# Patient Record
Sex: Female | Born: 1995 | Race: Black or African American | Hispanic: No | Marital: Single | Smoking: Never smoker
Health system: Southern US, Community
[De-identification: ages and names within clinical notes are randomized; demographics above are authoritative.]

## PROBLEM LIST (undated history)

## (undated) DIAGNOSIS — B999 Unspecified infectious disease: Secondary | ICD-10-CM

## (undated) DIAGNOSIS — Z789 Other specified health status: Secondary | ICD-10-CM

## (undated) HISTORY — PX: NO PAST SURGERIES: SHX2092

---
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
## 2017-07-29 NOTE — L&D Delivery Note (Signed)
Delivery Note Patient pushed for less than 10 minutes after she was noted to be C/C/+3. At 12:18 PM a viable and healthy female was delivered via Vaginal, Spontaneous (Presentation:Direct OP).  APGAR: 8/9 weight: pending.  Baby placed on maternal abdomen.  Delayed cord clamping was done and the cord cut by the father.   Cord Blood obtained. Placenta delivered spontaneously intact, 3 vessel cord noted.  Uterine atony alleviated with IV pitocin and massage.   No lacerations noted. Patient tolerated delivery well.  No complications.    Anesthesia:  Epidural Episiotomy:  N/A Lacerations:  None Suture Repair: N/A Est. Blood Loss (mL):  Titan level pending. Approximately 50 mL  Mom to postpartum.  Baby to Couplet care / Skin to Skin.  Phyllis Farmer, Phyllis Farmer 06/30/2018, 12:41 PM

## 2017-10-11 ENCOUNTER — Encounter (HOSPITAL_COMMUNITY): Payer: Self-pay

## 2017-10-11 ENCOUNTER — Other Ambulatory Visit: Payer: Self-pay

## 2017-10-11 DIAGNOSIS — R102 Pelvic and perineal pain: Secondary | ICD-10-CM | POA: Insufficient documentation

## 2017-10-11 DIAGNOSIS — Z3201 Encounter for pregnancy test, result positive: Secondary | ICD-10-CM | POA: Insufficient documentation

## 2017-10-11 LAB — COMPREHENSIVE METABOLIC PANEL
ALT: 13 U/L — ABNORMAL LOW (ref 14–54)
AST: 19 U/L (ref 15–41)
Albumin: 4.6 g/dL (ref 3.5–5.0)
Alkaline Phosphatase: 74 U/L (ref 38–126)
Anion gap: 8 (ref 5–15)
BUN: 13 mg/dL (ref 6–20)
CO2: 25 mmol/L (ref 22–32)
Calcium: 9.6 mg/dL (ref 8.9–10.3)
Chloride: 104 mmol/L (ref 101–111)
Creatinine, Ser: 0.71 mg/dL (ref 0.44–1.00)
GFR calc Af Amer: >60 mL/min (ref >60)
GFR calc non Af Amer: >60 mL/min (ref >60)
Glucose, Bld: 83 mg/dL (ref 65–99)
Potassium: 3.4 mmol/L — ABNORMAL LOW (ref 3.5–5.1)
Sodium: 137 mmol/L (ref 135–145)
Total Bilirubin: 1.6 mg/dL — ABNORMAL HIGH (ref 0.3–1.2)
Total Protein: 7.6 g/dL (ref 6.5–8.1)

## 2017-10-11 LAB — CBC
HCT: 39.4 % (ref 36.0–46.0)
Hemoglobin: 12.8 g/dL (ref 12.0–15.0)
MCH: 28.7 pg (ref 26.0–34.0)
MCHC: 32.5 g/dL (ref 30.0–36.0)
MCV: 88.3 fL (ref 78.0–100.0)
Platelets: 250 10*3/uL (ref 150–400)
RBC: 4.46 MIL/uL (ref 3.87–5.11)
RDW: 12.8 % (ref 11.5–15.5)
WBC: 7.7 10*3/uL (ref 4.0–10.5)

## 2017-10-11 LAB — I-STAT BETA HCG BLOOD, ED (MC, WL, AP ONLY): I-stat hCG, quantitative: 36.7 m[IU]/mL — ABNORMAL HIGH (ref <5)

## 2017-10-11 LAB — POC URINE PREG, ED: Preg Test, Ur: NEGATIVE

## 2017-10-11 NOTE — ED Triage Notes (Addendum)
Per pt; abdominal pain began last night after having unprotected intercourse w/her long-term partner.  Also c/o nausea last night w/chills.  Today, abd pain continues, but "when I walk on my toes it feels better."  States pain is intermittent and feels sharp when it comes. States she hasn't voided at all today but denies dysuria or vag d/c.

## 2017-10-12 ENCOUNTER — Emergency Department (HOSPITAL_COMMUNITY)
Admission: EM | Admit: 2017-10-12 | Discharge: 2017-10-12 | Disposition: A | Discharge status: Home / Self Care | Payer: Self-pay | Attending: Emergency Medicine | Admitting: Emergency Medicine

## 2017-10-12 ENCOUNTER — Emergency Department (HOSPITAL_COMMUNITY): Payer: Self-pay

## 2017-10-12 DIAGNOSIS — R102 Pelvic and perineal pain: Secondary | ICD-10-CM

## 2017-10-12 LAB — URINALYSIS, ROUTINE W REFLEX MICROSCOPIC
Bilirubin Urine: NEGATIVE
Glucose, UA: NEGATIVE mg/dL
Hgb urine dipstick: NEGATIVE
Leukocytes, UA: NEGATIVE
Nitrite: NEGATIVE
Protein, ur: NEGATIVE mg/dL
Specific Gravity, Urine: >1.03 — ABNORMAL HIGH (ref 1.005–1.030)
pH: 6 (ref 5.0–8.0)

## 2017-10-12 NOTE — ED Provider Notes (Signed)
[facility]-EMERGENCY DEPT Provider Note   CSN: [id number] Arrival date & time: 10/11/17  1829     History   Chief Complaint Chief Complaint  Patient presents with  . Abdominal Pain    HPI Phyllis Farmer is a 22 y.o. female.  Patient is a 22 year old female with no significant past medical history.  She presents today for evaluation of pelvic pain.  This started yesterday evening after intercourse with her boyfriend.  She denies any vaginal bleeding or discharge.  She denies any dysuria, hematuria, constipation, or diarrhea.  Her pain is crampy in nature.   The history is provided by the patient.  Abdominal Pain   This is a new problem. The current episode started yesterday. The problem occurs constantly. The problem has not changed since onset.The pain is located in the suprapubic region. The quality of the pain is cramping. The pain is moderate. Pertinent negatives include fever, diarrhea, hematochezia, melena, constipation and dysuria. Nothing aggravates the symptoms. Nothing relieves the symptoms.    No past medical history on file.  There are no active problems to display for this patient.     OB History    No data available       Home Medications    Prior to Admission medications   Not on File    Family History No family history on file.  Social History Social History   Tobacco Use  . Smoking status: Never Smoker  . Smokeless tobacco: Never Used  Substance Use Topics  . Alcohol use: Yes    Comment: occasionally  . Drug use: Yes    Types: Marijuana    Comment: daily     Allergies   Patient has no allergy information on record.   Review of Systems Review of Systems  Constitutional: Negative for fever.  Gastrointestinal: Positive for abdominal pain. Negative for constipation, diarrhea, hematochezia and melena.  Genitourinary: Negative for dysuria.  All other systems reviewed and are negative.    Physical  Exam Updated Vital Signs BP 124/74 (BP Location: Left Arm)   Pulse 71   Temp 98.9 F (37.2 C) (Oral)   Resp 18   Wt 55.3 kg (122 lb)   LMP 09/19/2017   SpO2 100%   Physical Exam  Constitutional: She is oriented to person, place, and time. She appears well-developed and well-nourished. No distress.  HENT:  Head: Normocephalic and atraumatic.  Neck: Normal range of motion. Neck supple.  Cardiovascular: Normal rate and regular rhythm. Exam reveals no gallop and no friction rub.  No murmur heard. Pulmonary/Chest: Effort normal and breath sounds normal. No respiratory distress. She has no wheezes.  Abdominal: Soft. Bowel sounds are normal. She exhibits no distension. There is tenderness in the suprapubic area. There is no rigidity, no rebound and no guarding.  There is tenderness to palpation in the suprapubic region.  Musculoskeletal: Normal range of motion.  Neurological: She is alert and oriented to person, place, and time.  Skin: Skin is warm and dry. She is not diaphoretic.  Nursing note and vitals reviewed.    ED Treatments / Results  Labs (all labs ordered are listed, but only abnormal results are displayed) Labs Reviewed  COMPREHENSIVE METABOLIC PANEL - Abnormal; Notable for the following components:      Result Value   Potassium 3.4 (*)    ALT 13 (*)    Total Bilirubin 1.6 (*)    All other components within normal limits  URINALYSIS, ROUTINE W REFLEX MICROSCOPIC -  Abnormal; Notable for the following components:   Specific Gravity, Urine >1.030 (*)    Ketones, ur TRACE (*)    All other components within normal limits  I-STAT BETA HCG BLOOD, ED ([facility], [facility], [facility] ONLY) - Abnormal; Notable for the following components:   I-stat hCG, quantitative 36.7 (*)    All other components within normal limits  CBC  POC URINE PREG, ED    EKG  EKG Interpretation None       Radiology No results found.  Procedures Procedures (including critical care time)  Medications Ordered  in ED Medications - No data to display   Initial Impression / Assessment and Plan / ED Course  I have reviewed the triage vital signs and the nursing notes.  Pertinent labs & imaging results that were available during my care of the patient were reviewed by me and considered in my medical decision making (see chart for details).  Patient presenting with lower abdominal discomfort.  Her urinalysis is unremarkable and urine pregnancy test reveals negative results.  She did have a quantitative beta hCG obtained which was mildly positive.  I am uncertain as to which test, the urine or the blood is most accurate at this point but nothing is showing up on the ultrasound indicating an ectopic pregnancy or intrauterine pregnancy.  I will have her follow-up at [facility] for repeat ultrasound and hCG level.  Final Clinical Impressions(s) / ED Diagnoses   Final diagnoses:  None    ED Discharge Orders    None       Geoffery Lyons, MD 10/12/17 [phone]

## 2017-10-12 NOTE — Discharge Instructions (Signed)
Tylenol 1000 mg every 8 hours as needed for pain.  [facility] 5-7 days for repeat hCG and ultrasound.  Return to the emergency department sooner if you develop worsening pain, high fever, severe bleeding, or other new and concerning symptoms.

## 2018-01-30 LAB — OB RESULTS CONSOLE GC/CHLAMYDIA: Chlamydia: NEGATIVE

## 2018-02-09 LAB — OB RESULTS CONSOLE HEPATITIS B SURFACE ANTIGEN: Hepatitis B Surface Ag: NEGATIVE

## 2018-02-09 LAB — OB RESULTS CONSOLE RUBELLA ANTIBODY, IGM: Rubella: IMMUNE

## 2018-02-09 LAB — OB RESULTS CONSOLE RPR: RPR: NONREACTIVE

## 2018-02-09 LAB — OB RESULTS CONSOLE GC/CHLAMYDIA: Gonorrhea: NEGATIVE

## 2018-02-09 LAB — OB RESULTS CONSOLE GBS: GBS: POSITIVE

## 2018-02-09 LAB — OB RESULTS CONSOLE ABO/RH: RH Type: POSITIVE

## 2018-02-09 LAB — OB RESULTS CONSOLE ANTIBODY SCREEN: Antibody Screen: NEGATIVE

## 2018-02-18 ENCOUNTER — Ambulatory Visit (HOSPITAL_COMMUNITY)
Admission: RE | Admit: 2018-02-18 | Discharge: 2018-02-18 | Disposition: A | Discharge status: Home / Self Care | Payer: Medicaid Other | Source: Ambulatory Visit | Attending: Obstetrics and Gynecology | Admitting: Obstetrics and Gynecology

## 2018-02-18 ENCOUNTER — Encounter (HOSPITAL_COMMUNITY): Payer: Self-pay

## 2018-02-18 ENCOUNTER — Other Ambulatory Visit (HOSPITAL_COMMUNITY): Payer: Self-pay | Admitting: Obstetrics and Gynecology

## 2018-02-18 DIAGNOSIS — Z3689 Encounter for other specified antenatal screening: Secondary | ICD-10-CM

## 2018-02-18 DIAGNOSIS — Z3A21 21 weeks gestation of pregnancy: Secondary | ICD-10-CM

## 2018-02-18 DIAGNOSIS — R9389 Abnormal findings on diagnostic imaging of other specified body structures: Secondary | ICD-10-CM

## 2018-03-03 ENCOUNTER — Ambulatory Visit (HOSPITAL_COMMUNITY)
Admission: RE | Admit: 2018-03-03 | Discharge: 2018-03-03 | Disposition: A | Discharge status: Home / Self Care | Payer: Medicaid Other | Source: Ambulatory Visit | Attending: Obstetrics and Gynecology | Admitting: Obstetrics and Gynecology

## 2018-03-03 ENCOUNTER — Encounter (HOSPITAL_COMMUNITY): Payer: Self-pay

## 2018-06-18 ENCOUNTER — Encounter (HOSPITAL_COMMUNITY): Payer: Self-pay

## 2018-06-18 ENCOUNTER — Inpatient Hospital Stay (HOSPITAL_COMMUNITY)
Admission: AD | Admit: 2018-06-18 | Discharge: 2018-06-18 | Disposition: A | Discharge status: Home / Self Care | Payer: Medicaid Other | Source: Ambulatory Visit | Attending: Obstetrics and Gynecology | Admitting: Obstetrics and Gynecology

## 2018-06-18 DIAGNOSIS — Z3A38 38 weeks gestation of pregnancy: Secondary | ICD-10-CM | POA: Diagnosis not present

## 2018-06-18 DIAGNOSIS — N898 Other specified noninflammatory disorders of vagina: Secondary | ICD-10-CM | POA: Diagnosis not present

## 2018-06-18 DIAGNOSIS — O26893 Other specified pregnancy related conditions, third trimester: Secondary | ICD-10-CM | POA: Diagnosis present

## 2018-06-18 DIAGNOSIS — Z3689 Encounter for other specified antenatal screening: Secondary | ICD-10-CM

## 2018-06-18 HISTORY — DX: Other specified health status: Z78.9

## 2018-06-18 LAB — WET PREP, GENITAL
Sperm: NONE SEEN
Trich, Wet Prep: NONE SEEN
Yeast Wet Prep HPF POC: NONE SEEN

## 2018-06-18 LAB — POCT FERN TEST: POCT Fern Test: NEGATIVE

## 2018-06-18 LAB — AMNISURE RUPTURE OF MEMBRANE (ROM) NOT AT ARMC: Amnisure ROM: NEGATIVE

## 2018-06-18 NOTE — Discharge Instructions (Signed)
Braxton Hicks Contractions °Contractions of the uterus can occur throughout pregnancy, but they are not always a sign that you are in labor. You may have practice contractions called Braxton Hicks contractions. These false labor contractions are sometimes confused with true labor. °What are Braxton Hicks contractions? °Braxton Hicks contractions are tightening movements that occur in the muscles of the uterus before labor. Unlike true labor contractions, these contractions do not result in opening (dilation) and thinning of the cervix. Toward the end of pregnancy (32-34 weeks), Braxton Hicks contractions can happen more often and may become stronger. These contractions are sometimes difficult to tell apart from true labor because they can be very uncomfortable. You should not feel embarrassed if you go to the hospital with false labor. °Sometimes, the only way to tell if you are in true labor is for your health care provider to look for changes in the cervix. The health care provider will do a physical exam and may monitor your contractions. If you are not in true labor, the exam should show that your cervix is not dilating and your water has not broken. °If there are other health problems associated with your pregnancy, it is completely safe for you to be sent home with false labor. You may continue to have Braxton Hicks contractions until you go into true labor. °How to tell the difference between true labor and false labor °True labor °· Contractions last 30-70 seconds. °· Contractions become very regular. °· Discomfort is usually felt in the top of the uterus, and it spreads to the lower abdomen and low back. °· Contractions do not go away with walking. °· Contractions usually become more intense and increase in frequency. °· The cervix dilates and gets thinner. °False labor °· Contractions are usually shorter and not as strong as true labor contractions. °· Contractions are usually irregular. °· Contractions  are often felt in the front of the lower abdomen and in the groin. °· Contractions may go away when you walk around or change positions while lying down. °· Contractions get weaker and are shorter-lasting as time goes on. °· The cervix usually does not dilate or become thin. °Follow these instructions at home: °· Take over-the-counter and prescription medicines only as told by your health care provider. °· Keep up with your usual exercises and follow other instructions from your health care provider. °· Eat and drink lightly if you think you are going into labor. °· If Braxton Hicks contractions are making you uncomfortable: °? Change your position from lying down or resting to walking, or change from walking to resting. °? Sit and rest in a tub of warm water. °? Drink enough fluid to keep your urine pale yellow. Dehydration may cause these contractions. °? Do slow and deep breathing several times an hour. °· Keep all follow-up prenatal visits as told by your health care provider. This is important. °Contact a health care provider if: °· You have a fever. °· You have continuous pain in your abdomen. °Get help right away if: °· Your contractions become stronger, more regular, and closer together. °· You have fluid leaking or gushing from your vagina. °· You pass blood-tinged mucus (bloody show). °· You have bleeding from your vagina. °· You have low back pain that you never had before. °· You feel your baby’s head pushing down and causing pelvic pressure. °· Your baby is not moving inside you as much as it used to. °Summary °· Contractions that occur before labor are called Braxton   Hicks contractions, false labor, or practice contractions. °· Braxton Hicks contractions are usually shorter, weaker, farther apart, and less regular than true labor contractions. True labor contractions usually become progressively stronger and regular and they become more frequent. °· Manage discomfort from Braxton Hicks contractions by  changing position, resting in a warm bath, drinking plenty of water, or practicing deep breathing. °This information is not intended to replace advice given to you by your health care provider. Make sure you discuss any questions you have with your health care provider. °Document Released: 11/28/2016 Document Revised: 11/28/2016 Document Reviewed: 11/28/2016 °Elsevier Interactive Patient Education © 2018 Else[employer] Inc. ° °

## 2018-06-18 NOTE — MAU Note (Signed)
Pt stated she has been gushing fluid 2-3 days. Fluid is clear. Stated she has been having some ctx off and on about q 10 min.

## 2018-06-18 NOTE — MAU Provider Note (Signed)
History   [id number]   Chief Complaint  Patient presents with  . Rupture of Membranes    HPI Phyllis Farmer is a 22 y.o. female  G1P0 @38 .6 wks here with report of leaking clear fluid around 0800.  Leaking of fluid has continued. Pt reports irregular contractions. She denies vaginal bleeding. Last intercourse was not recent. She reports good fetal movement. All other systems negative.    Patient's last menstrual period was 09/19/2017.  OB History  Gravida Para Term Preterm AB Living  1            SAB TAB Ectopic Multiple Live Births               # Outcome Date GA Lbr Len/2nd Weight Sex Delivery Anes PTL Lv  1 Current             Past Medical History:  Diagnosis Date  . Medical history non-contributory     No family history on file.  Social History   Socioeconomic History  . Marital status: Single    Spouse name: Not on file  . Number of children: Not on file  . Years of education: Not on file  . Highest education level: Not on file  Occupational History  . Not on file  Social Needs  . Financial resource strain: Not on file  . Food insecurity:    Worry: Not on file    Inability: Not on file  . Transportation needs:    Medical: Not on file    Non-medical: Not on file  Tobacco Use  . Smoking status: Never Smoker  . Smokeless tobacco: Never Used  Substance and Sexual Activity  . Alcohol use: Yes    Comment: occasionally  . Drug use: Yes    Types: Marijuana    Comment: daily  . Sexual activity: Yes    Birth control/protection: None  Lifestyle  . Physical activity:    Days per week: Not on file    Minutes per session: Not on file  . Stress: Not on file  Relationships  . Social connections:    Talks on phone: Not on file    Gets together: Not on file    Attends religious service: Not on file    Active member of club or organization: Not on file    Attends meetings of clubs or organizations: Not on file    Relationship status: Not on file  Other  Topics Concern  . Not on file  Social History Narrative  . Not on file    No Known Allergies  No current facility-administered medications on file prior to encounter.    Current Outpatient Medications on File Prior to Encounter  Medication Sig Dispense Refill  . Prenatal Vit-Fe Fumarate-FA (PRENATAL VITAMIN PO) Take by mouth.       Review of Systems  Gastrointestinal: Negative for abdominal pain.  Genitourinary: Positive for vaginal discharge. Negative for vaginal bleeding.     Physical Exam   Vitals:   06/18/18 1928 06/18/18 2128  BP: 136/75 (!) 101/45  Pulse: 83   Resp: 18   Temp: 99.1 F (37.3 C)     Physical Exam  Constitutional: She is oriented to person, place, and time. She appears well-developed and well-nourished. No distress.  HENT:  Head: Normocephalic and atraumatic.  Neck: Normal range of motion.  Cardiovascular: Normal rate.  Respiratory: Effort normal. No respiratory distress.  Genitourinary:  Genitourinary Comments: SSE: mod thin yellow discharge, no pool, fern neg  SVE 1.5/60/-2, vtx  Musculoskeletal: Normal range of motion.  Neurological: She is alert and oriented to person, place, and time.  Skin: Skin is warm and dry.  Psychiatric: She has a normal mood and affect.  EFM: 135 bpm, mod variability, + accels, no decels Toco: irregular  Results for orders placed or performed during the hospital encounter of 06/18/18 (from the past 24 hour(s))  Wet prep, genital     Status: Abnormal   Collection Time: 06/18/18  8:10 PM  Result Value Ref Range   Yeast Wet Prep HPF POC NONE SEEN NONE SEEN   Trich, Wet Prep NONE SEEN NONE SEEN   Clue Cells Wet Prep HPF POC PRESENT (A) NONE SEEN   WBC, Wet Prep HPF POC TOO NUMEROUS TO COUNT (A) NONE SEEN   Sperm NONE SEEN   Amnisure rupture of membrane (rom)not at [facility]     Status: None   Collection Time: 06/18/18  8:54 PM  Result Value Ref Range   Amnisure ROM NEGATIVE   Fern Test     Status: None   Collection  Time: 06/18/18  9:08 PM  Result Value Ref Range   POCT Fern Test Negative = intact amniotic membranes     MAU Course  Procedures  MDM Labs ordered and reviewed. No evidence of SROM. GC pending. Stable for discharge home.   Assessment and Plan   1. [redacted] weeks gestation of pregnancy   2. NST (non-stress test) reactive   3. Vaginal discharge during pregnancy in third trimester    Discharge home Follow up at [facility] as scheduled PTL precautions  Allergies as of 06/18/2018   No Known Allergies     Medication List    TAKE these medications   PRENATAL VITAMIN PO Take by mouth.      Donette Larry, CNM 06/18/2018 9:45 PM

## 2018-06-19 LAB — GC/CHLAMYDIA PROBE AMP (~~LOC~~) NOT AT ARMC
Chlamydia: NEGATIVE
Neisseria Gonorrhea: NEGATIVE

## 2018-06-22 ENCOUNTER — Inpatient Hospital Stay (HOSPITAL_COMMUNITY)
Admission: AD | Admit: 2018-06-22 | Discharge: 2018-06-22 | Disposition: A | Discharge status: Home / Self Care | Payer: Medicaid Other | Source: Ambulatory Visit | Attending: Obstetrics and Gynecology | Admitting: Obstetrics and Gynecology

## 2018-06-22 ENCOUNTER — Encounter (HOSPITAL_COMMUNITY): Payer: Self-pay

## 2018-06-22 DIAGNOSIS — Z0371 Encounter for suspected problem with amniotic cavity and membrane ruled out: Secondary | ICD-10-CM | POA: Diagnosis not present

## 2018-06-22 DIAGNOSIS — Z3A39 39 weeks gestation of pregnancy: Secondary | ICD-10-CM | POA: Insufficient documentation

## 2018-06-22 DIAGNOSIS — O471 False labor at or after 37 completed weeks of gestation: Secondary | ICD-10-CM | POA: Diagnosis not present

## 2018-06-22 DIAGNOSIS — O479 False labor, unspecified: Secondary | ICD-10-CM

## 2018-06-22 DIAGNOSIS — O4292 Full-term premature rupture of membranes, unspecified as to length of time between rupture and onset of labor: Secondary | ICD-10-CM | POA: Diagnosis not present

## 2018-06-22 LAB — POCT FERN TEST: POCT Fern Test: NEGATIVE

## 2018-06-22 LAB — AMNISURE RUPTURE OF MEMBRANE (ROM) NOT AT ARMC: Amnisure ROM: NEGATIVE

## 2018-06-22 NOTE — Discharge Instructions (Signed)
Braxton Hicks Contractions °Contractions of the uterus can occur throughout pregnancy, but they are not always a sign that you are in labor. You may have practice contractions called Braxton Hicks contractions. These false labor contractions are sometimes confused with true labor. °What are Braxton Hicks contractions? °Braxton Hicks contractions are tightening movements that occur in the muscles of the uterus before labor. Unlike true labor contractions, these contractions do not result in opening (dilation) and thinning of the cervix. Toward the end of pregnancy (32-34 weeks), Braxton Hicks contractions can happen more often and may become stronger. These contractions are sometimes difficult to tell apart from true labor because they can be very uncomfortable. You should not feel embarrassed if you go to the hospital with false labor. °Sometimes, the only way to tell if you are in true labor is for your health care provider to look for changes in the cervix. The health care provider will do a physical exam and may monitor your contractions. If you are not in true labor, the exam should show that your cervix is not dilating and your water has not broken. °If there are other health problems associated with your pregnancy, it is completely safe for you to be sent home with false labor. You may continue to have Braxton Hicks contractions until you go into true labor. °How to tell the difference between true labor and false labor °True labor °· Contractions last 30-70 seconds. °· Contractions become very regular. °· Discomfort is usually felt in the top of the uterus, and it spreads to the lower abdomen and low back. °· Contractions do not go away with walking. °· Contractions usually become more intense and increase in frequency. °· The cervix dilates and gets thinner. °False labor °· Contractions are usually shorter and not as strong as true labor contractions. °· Contractions are usually irregular. °· Contractions  are often felt in the front of the lower abdomen and in the groin. °· Contractions may go away when you walk around or change positions while lying down. °· Contractions get weaker and are shorter-lasting as time goes on. °· The cervix usually does not dilate or become thin. °Follow these instructions at home: °· Take over-the-counter and prescription medicines only as told by your health care provider. °· Keep up with your usual exercises and follow other instructions from your health care provider. °· Eat and drink lightly if you think you are going into labor. °· If Braxton Hicks contractions are making you uncomfortable: °? Change your position from lying down or resting to walking, or change from walking to resting. °? Sit and rest in a tub of warm water. °? Drink enough fluid to keep your urine pale yellow. Dehydration may cause these contractions. °? Do slow and deep breathing several times an hour. °· Keep all follow-up prenatal visits as told by your health care provider. This is important. °Contact a health care provider if: °· You have a fever. °· You have continuous pain in your abdomen. °Get help right away if: °· Your contractions become stronger, more regular, and closer together. °· You have fluid leaking or gushing from your vagina. °· You pass blood-tinged mucus (bloody show). °· You have bleeding from your vagina. °· You have low back pain that you never had before. °· You feel your baby’s head pushing down and causing pelvic pressure. °· Your baby is not moving inside you as much as it used to. °Summary °· Contractions that occur before labor are called Braxton   Hicks contractions, false labor, or practice contractions. °· Braxton Hicks contractions are usually shorter, weaker, farther apart, and less regular than true labor contractions. True labor contractions usually become progressively stronger and regular and they become more frequent. °· Manage discomfort from Braxton Hicks contractions by  changing position, resting in a warm bath, drinking plenty of water, or practicing deep breathing. °This information is not intended to replace advice given to you by your health care provider. Make sure you discuss any questions you have with your health care provider. °Document Released: 11/28/2016 Document Revised: 11/28/2016 Document Reviewed: 11/28/2016 °Elsevier Interactive Patient Education © 2018 Elsevier Inc. ° °Safe Medications in Pregnancy  ° °Acne: °Benzoyl Peroxide °Salicylic Acid ° °Backache/Headache: °Tylenol: 2 regular strength every 4 hours OR °             2 Extra strength every 6 hours ° °Colds/Coughs/Allergies: °Benadryl (alcohol free) 25 mg every 6 hours as needed °Breath right strips °Claritin °Cepacol throat lozenges °Chloraseptic throat spray °Cold-Eeze- up to three times per day °Cough drops, alcohol free °Flonase (by prescription only) °Guaifenesin °Mucinex °Robitussin DM (plain only, alcohol free) °Saline nasal spray/drops °Sudafed (pseudoephedrine) & Actifed ** use only after [redacted] weeks gestation and if you do not have high blood pressure °Tylenol °Vicks Vaporub °Zinc lozenges °Zyrtec  ° °Constipation: °Colace °Ducolax suppositories °Fleet enema °Glycerin suppositories °Metamucil °Milk of magnesia °Miralax °Senokot °Smooth move tea ° °Diarrhea: °Kaopectate °Imodium A-D ° °*NO pepto Bismol ° °Hemorrhoids: °Anusol °Anusol HC °Preparation H °Tucks ° °Indigestion: °Tums °Maalox °Mylanta °Zantac  °Pepcid ° °Insomnia: °Benadryl (alcohol free) 25mg every 6 hours as needed °Tylenol PM °Unisom, no Gelcaps ° °Leg Cramps: °Tums °MagGel ° °Nausea/Vomiting:  °Bonine °Dramamine °Emetrol °Ginger extract °Sea bands °Meclizine  °Nausea medication to take during pregnancy:  °Unisom (doxylamine succinate 25 mg tablets) Take one tablet daily at bedtime. If symptoms are not adequately controlled, the dose can be increased to a maximum recommended dose of two tablets daily (1/2 tablet in the morning, 1/2 tablet  mid-afternoon and one at bedtime). °Vitamin B6 100mg tablets. Take one tablet twice a day (up to 200 mg per day). ° °Skin Rashes: °Aveeno products °Benadryl cream or 25mg every 6 hours as needed °Calamine Lotion °1% cortisone cream ° °Yeast infection: °Gyne-lotrimin 7 °Monistat 7 ° ° °**If taking multiple medications, please check labels to avoid duplicating the same active ingredients °**take medication as directed on the label °** Do not exceed 4000 mg of tylenol in 24 hours °**Do not take medications that contain aspirin or ibuprofen ° ° ° ° °

## 2018-06-22 NOTE — MAU Note (Signed)
CTX since 0230. No VB.  + FM.  Reports having some fluid run down her leg but nothing since.

## 2018-06-22 NOTE — MAU Note (Signed)
I have communicated with Cleone Slimaroline Neill, CNM and reviewed vital signs:  Vitals:   06/22/18 0500 06/22/18 0528  BP:  127/71  Pulse:  82  Resp:  18  Temp: 98 F (36.7 C)   SpO2:      Vaginal exam:  Dilation: 3 Effacement (%): 70 Cervical Position: Posterior Station: -3 Exam by:: R. Craft, RN,   Also reviewed contraction pattern and that non-stress test is reactive.  It has been documented that patient is contracting every 4-8 minutes with no cervical change over 1 hour not indicating active labor.  Patient denies any other complaints.  Based on this report provider has given order for discharge.  A discharge order and diagnosis entered by a provider.   Labor discharge instructions reviewed with patient.

## 2018-06-22 NOTE — MAU Provider Note (Signed)
S: Ms. Phyllis Farmer is a 22 y.o. G1P0 at 3253w3d  who presents to MAU today complaining of leaking of fluid since 0230. She denies vaginal bleeding. She endorses contractions. She reports normal fetal movement.    O: BP 133/73   Pulse (!) 102   Temp 98.5 F (36.9 C)   Resp 19   Ht 5\' 4"  (1.626 Farmer)   Wt 74.6 kg   LMP 09/19/2017   SpO2 98%   BMI 28.24 kg/Farmer  GENERAL: Well-developed, well-nourished female in no acute distress.  HEAD: Normocephalic, atraumatic.  CHEST: Normal effort of breathing, regular heart rate ABDOMEN: Soft, nontender, gravid   Cervical exam:  Dilation: 3 Effacement (%): 70 Cervical Position: Posterior Exam by:: Kathrin Penneryan Craft, RN   Fetal Monitoring: Baseline: 130 Variability: moderate Accelerations: 15x15 Decelerations: none Contractions: irregular uc's  Fern- negative  Results for orders placed or performed during the hospital encounter of 06/22/18 (from the past 24 hour(s))  [facility] ([facility]     Status: None   Collection Time: 06/22/18  4:46 AM  Result Value Ref Range   Amnisure ROM NEGATIVE     A: SIUP at 653w3d  Membranes intact  No cervical change after an hour and patient comfortably talking through intermittent contractions.  P: Discharge patient home Labor precautions reviewed Patient to follow up with [facility] as scheduled for prenatal care Patient can return to MAU if condition worsens or changes  Rolm Bookbindereill, Phyllis Farmer, [place] 06/22/2018 5:12 AM

## 2018-06-29 ENCOUNTER — Inpatient Hospital Stay (HOSPITAL_COMMUNITY): Payer: Medicaid Other | Admitting: Anesthesiology

## 2018-06-29 ENCOUNTER — Inpatient Hospital Stay (HOSPITAL_COMMUNITY)
Admission: AD | Admit: 2018-06-29 | Discharge: 2018-07-02 | DRG: 807 | Disposition: A | Discharge status: Home / Self Care | Payer: Medicaid Other | Attending: Obstetrics and Gynecology | Admitting: Obstetrics and Gynecology

## 2018-06-29 ENCOUNTER — Other Ambulatory Visit: Payer: Self-pay

## 2018-06-29 ENCOUNTER — Encounter (HOSPITAL_COMMUNITY): Payer: Self-pay | Admitting: *Deleted

## 2018-06-29 DIAGNOSIS — O99824 Streptococcus B carrier state complicating childbirth: Secondary | ICD-10-CM | POA: Diagnosis present

## 2018-06-29 DIAGNOSIS — O4290 Premature rupture of membranes, unspecified as to length of time between rupture and onset of labor, unspecified weeks of gestation: Secondary | ICD-10-CM | POA: Diagnosis present

## 2018-06-29 DIAGNOSIS — Z3483 Encounter for supervision of other normal pregnancy, third trimester: Secondary | ICD-10-CM | POA: Diagnosis present

## 2018-06-29 DIAGNOSIS — O4292 Full-term premature rupture of membranes, unspecified as to length of time between rupture and onset of labor: Secondary | ICD-10-CM | POA: Diagnosis present

## 2018-06-29 DIAGNOSIS — O1490 Unspecified pre-eclampsia, unspecified trimester: Secondary | ICD-10-CM | POA: Diagnosis present

## 2018-06-29 DIAGNOSIS — Z3A40 40 weeks gestation of pregnancy: Secondary | ICD-10-CM | POA: Diagnosis not present

## 2018-06-29 DIAGNOSIS — O1404 Mild to moderate pre-eclampsia, complicating childbirth: Secondary | ICD-10-CM | POA: Diagnosis present

## 2018-06-29 HISTORY — DX: Unspecified infectious disease: B99.9

## 2018-06-29 LAB — CBC
HCT: 30.8 % — ABNORMAL LOW (ref 36.0–46.0)
Hemoglobin: 9.7 g/dL — ABNORMAL LOW (ref 12.0–15.0)
MCH: 26.1 pg (ref 26.0–34.0)
MCHC: 31.5 g/dL (ref 30.0–36.0)
MCV: 83 fL (ref 80.0–100.0)
Platelets: 278 10*3/uL (ref 150–400)
RBC: 3.71 MIL/uL — ABNORMAL LOW (ref 3.87–5.11)
RDW: 14.7 % (ref 11.5–15.5)
WBC: 9 10*3/uL (ref 4.0–10.5)
nRBC: 0 % (ref 0.0–0.2)

## 2018-06-29 LAB — ABO/RH: ABO/RH(D): AB POS

## 2018-06-29 LAB — POCT FERN TEST
POCT Fern Test: NEGATIVE
POCT Fern Test: POSITIVE

## 2018-06-29 LAB — TYPE AND SCREEN
ABO/RH(D): AB POS
Antibody Screen: NEGATIVE

## 2018-06-29 MED ORDER — SOD CITRATE-CITRIC ACID 500-334 MG/5ML PO SOLN
30.0000 mL | ORAL | Status: DC | PRN
  Administered 2018-06-29: 30 mL via ORAL
  Filled 2018-06-29: qty 15

## 2018-06-29 MED ORDER — EPHEDRINE 5 MG/ML INJ
10.0000 mg | INTRAVENOUS | Status: DC | PRN
  Filled 2018-06-29: qty 2

## 2018-06-29 MED ORDER — LIDOCAINE HCL (PF) 1 % IJ SOLN
30.0000 mL | INTRAMUSCULAR | Status: DC | PRN
  Filled 2018-06-29: qty 30

## 2018-06-29 MED ORDER — PHENYLEPHRINE 40 MCG/ML (10ML) SYRINGE FOR IV PUSH (FOR BLOOD PRESSURE SUPPORT)
80.0000 ug | PREFILLED_SYRINGE | INTRAVENOUS | Status: DC | PRN
  Filled 2018-06-29: qty 5
  Filled 2018-06-29: qty 10

## 2018-06-29 MED ORDER — OXYCODONE-ACETAMINOPHEN 5-325 MG PO TABS: 1.0000 | ORAL_TABLET | ORAL | Status: DC | PRN

## 2018-06-29 MED ORDER — ACETAMINOPHEN 325 MG PO TABS: 650.0000 mg | ORAL_TABLET | ORAL | Status: DC | PRN

## 2018-06-29 MED ORDER — LACTATED RINGERS IV SOLN: 500.0000 mL | INTRAVENOUS | Status: DC | PRN

## 2018-06-29 MED ORDER — OXYTOCIN BOLUS FROM INFUSION
500.0000 mL | Freq: Once | INTRAVENOUS | Status: AC
  Administered 2018-06-30: 500 mL via INTRAVENOUS

## 2018-06-29 MED ORDER — LIDOCAINE HCL (PF) 1 % IJ SOLN
INTRAMUSCULAR | Status: DC | PRN
  Administered 2018-06-29: 5 mL via EPIDURAL

## 2018-06-29 MED ORDER — OXYTOCIN 40 UNITS IN LACTATED RINGERS INFUSION - SIMPLE MED
2.5000 [IU]/h | INTRAVENOUS | Status: DC
  Administered 2018-06-30: 2.5 [IU]/h via INTRAVENOUS

## 2018-06-29 MED ORDER — LACTATED RINGERS IV SOLN: 500.0000 mL | Freq: Once | INTRAVENOUS | Status: DC

## 2018-06-29 MED ORDER — OXYTOCIN 40 UNITS IN LACTATED RINGERS INFUSION - SIMPLE MED
1.0000 m[IU]/min | INTRAVENOUS | Status: DC
  Administered 2018-06-29: 1 m[IU]/min via INTRAVENOUS
  Filled 2018-06-29 (×2): qty 1000

## 2018-06-29 MED ORDER — SODIUM CHLORIDE 0.9 % IV SOLN
5.0000 10*6.[IU] | Freq: Once | INTRAVENOUS | Status: AC
  Administered 2018-06-29: 5 10*6.[IU] via INTRAVENOUS
  Filled 2018-06-29: qty 5

## 2018-06-29 MED ORDER — TERBUTALINE SULFATE 1 MG/ML IJ SOLN
0.2500 mg | Freq: Once | INTRAMUSCULAR | Status: DC | PRN
  Filled 2018-06-29: qty 1

## 2018-06-29 MED ORDER — DIPHENHYDRAMINE HCL 50 MG/ML IJ SOLN: 12.5000 mg | INTRAMUSCULAR | Status: DC | PRN

## 2018-06-29 MED ORDER — PENICILLIN G 3 MILLION UNITS IVPB - SIMPLE MED
3.0000 10*6.[IU] | INTRAVENOUS | Status: DC
  Administered 2018-06-29 – 2018-06-30 (×5): 3 10*6.[IU] via INTRAVENOUS
  Filled 2018-06-29 (×6): qty 100

## 2018-06-29 MED ORDER — LACTATED RINGERS IV SOLN
INTRAVENOUS | Status: DC
  Administered 2018-06-29 – 2018-06-30 (×4): via INTRAVENOUS

## 2018-06-29 MED ORDER — PHENYLEPHRINE 40 MCG/ML (10ML) SYRINGE FOR IV PUSH (FOR BLOOD PRESSURE SUPPORT)
80.0000 ug | PREFILLED_SYRINGE | INTRAVENOUS | Status: DC | PRN
  Administered 2018-06-29: 80 ug via INTRAVENOUS
  Filled 2018-06-29: qty 5

## 2018-06-29 MED ORDER — OXYCODONE-ACETAMINOPHEN 5-325 MG PO TABS: 2.0000 | ORAL_TABLET | ORAL | Status: DC | PRN

## 2018-06-29 MED ORDER — FENTANYL CITRATE (PF) 100 MCG/2ML IJ SOLN
50.0000 ug | INTRAMUSCULAR | Status: DC | PRN
  Administered 2018-06-29 (×2): 100 ug via INTRAVENOUS
  Filled 2018-06-29 (×2): qty 2

## 2018-06-29 MED ORDER — EPHEDRINE 5 MG/ML INJ: 10.0000 mg | INTRAVENOUS | Status: DC | PRN

## 2018-06-29 MED ORDER — FENTANYL 2.5 MCG/ML BUPIVACAINE 1/10 % EPIDURAL INFUSION (WH - ANES)
14.0000 mL/h | INTRAMUSCULAR | Status: DC | PRN
  Administered 2018-06-29 – 2018-06-30 (×3): 14 mL/h via EPIDURAL
  Filled 2018-06-29 (×3): qty 100

## 2018-06-29 MED ORDER — FLEET ENEMA 7-19 GM/118ML RE ENEM: 1.0000 | ENEMA | RECTAL | Status: DC | PRN

## 2018-06-29 MED ORDER — PHENYLEPHRINE 40 MCG/ML (10ML) SYRINGE FOR IV PUSH (FOR BLOOD PRESSURE SUPPORT): 80.0000 ug | PREFILLED_SYRINGE | INTRAVENOUS | Status: DC | PRN

## 2018-06-29 MED ORDER — LACTATED RINGERS IV SOLN
500.0000 mL | Freq: Once | INTRAVENOUS | Status: AC
  Administered 2018-06-29: 1000 mL via INTRAVENOUS

## 2018-06-29 MED ORDER — ONDANSETRON HCL 4 MG/2ML IJ SOLN
4.0000 mg | Freq: Four times a day (QID) | INTRAMUSCULAR | Status: DC | PRN
  Administered 2018-06-30: 4 mg via INTRAVENOUS
  Filled 2018-06-29: qty 2

## 2018-06-29 NOTE — MAU Note (Signed)
Urine in lab 

## 2018-06-29 NOTE — Progress Notes (Signed)
Phyllis SonBrianna Farmer is a 22 y.o. G1P0 at 459w3d   Subjective: No complaints  Objective: BP (!) 144/73   Pulse 83   Temp 97.6 F (36.4 C) (Oral)   Resp 20   Wt 74.4 kg   LMP 09/19/2017   BMI 28.15 kg/m  No intake/output data recorded. No intake/output data recorded.  FHT:  FHR: 115 bpm, variability: moderate,  accelerations:  Present,  decelerations:  Absent UC:   q 2-4 min SVE:   Dilation: 4 Effacement (%): 100 Station: -1 Exam by:: Enis SlipperJane Bailey, RN  Labs: Lab Results  Component Value Date   WBC 9.0 06/29/2018   HGB 9.7 (L) 06/29/2018   HCT 30.8 (L) 06/29/2018   MCV 83.0 06/29/2018   PLT 278 06/29/2018    Assessment / Plan: Augmentation of labor, progressing well  Labor: Cont to titrate pitocin per protocol.  Pt progressing well. Preeclampsia:  no signs or symptoms of toxicity Fetal Wellbeing:  Category I Pain Control:  per pt request I/D:  GBS positive Anticipated MOD:  NSVD  Purcell Nailsngela Y Gerod Caligiuri 06/29/2018, 4:10 PM

## 2018-06-29 NOTE — Anesthesia Pain Management Evaluation Note (Signed)
  CRNA Pain Management Visit Note  Patient: Phyllis Farmer, 22 y.o., female  "Hello [facility] am a [facility]. We have an anesthesia team available at all times to provide care throughout the hospital, including epidural management and anesthesia for C-section. I don't know your plan for the delivery whether it a natural birth, water birth, IV sedation, nitrous supplementation, doula or epidural, but we want to meet your pain goals."   1.Was your pain managed to your expectations on prior hospitalizations?   No prior hospitalizations  2.What is your expectation for pain management during this hospitalization?     IV pain meds  3.How can we help you reach that goal? unsure  Record the patient's initial score and the patient's pain goal.   Pain: 0  Pain Goal: 7 [facility] wants you to be able to say your pain was always managed very well.  Cephus ShellingBURGER,Jakyrie Totherow 06/29/2018

## 2018-06-29 NOTE — Anesthesia Procedure Notes (Addendum)
Epidural Patient location during procedure: OB Start time: 06/29/2018 4:20 PM End time: 06/29/2018 4:36 PM  Staffing Anesthesiologist: Trevor IhaHouser, Wilmoth Rasnic A, MD Performed: anesthesiologist   Preanesthetic Checklist Completed: patient identified, site marked, surgical consent, pre-op evaluation, timeout performed, IV checked, risks and benefits discussed and monitors and equipment checked  Epidural Patient position: sitting Prep: site prepped and draped and DuraPrep Patient monitoring: continuous pulse ox and blood pressure Approach: midline Location: L3-L4 Injection technique: LOR air  Needle:  Needle type: Tuohy  Needle gauge: 17 G Needle length: 9 cm and 9 Needle insertion depth: 6 cm Catheter type: closed end flexible Catheter size: 19 Gauge Catheter at skin depth: 11 cm Test dose: negative  Assessment Events: blood not aspirated, injection not painful, no injection resistance, negative IV test and no paresthesia  Additional Notes Patient identified. Risks/Benefits/Options discussed with patient including but not limited to bleeding, infection, nerve damage, paralysis, failed block, incomplete pain control, headache, blood pressure changes, nausea, vomiting, reactions to medication both or allergic, itching and postpartum back pain. Confirmed with bedside nurse the patient's most recent platelet count. Confirmed with patient that they are not currently taking any anticoagulation, have any bleeding history or any family history of bleeding disorders. Patient expressed understanding and wished to proceed. All questions were answered. Sterile technique was used throughout the entire procedure. Please see nursing notes for vital signs. Test dose was given through epidural needle and negative prior to continuing to dose epidural or start infusion. Warning signs of high block given to the patient including shortness of breath, tingling/numbness in hands, complete motor block, or any  concerning symptoms with instructions to call for help. Patient was given instructions on fall risk and not to get out of bed. All questions and concerns addressed with instructions to call with any issues. 1 Attempt (S) . Patient tolerated procedure well.

## 2018-06-29 NOTE — MAU Provider Note (Signed)
First Provider Initiated Contact with Patient 06/29/18 1103       S: Ms. Phyllis Farmer is a 22 y.o. G1P0 at 8230w3d  who presents to MAU today complaining of leaking of fluid since 8am this morning. She denies vaginal bleeding. She endorses contractions. She reports normal fetal movement.    O: BP 128/70 (BP Location: Left Arm)   Pulse (!) 103   Temp 98.7 F (37.1 C) (Oral)   Resp 17   Wt 74.4 kg   LMP 09/19/2017   BMI 28.15 kg/m  GENERAL: Well-developed, well-nourished female in no acute distress.  HEAD: Normocephalic, atraumatic.  CHEST: Normal effort of breathing, regular heart rate ABDOMEN: Soft, nontender, gravid PELVIC: Normal external female genitalia. Vagina is pink and rugated. Cervix with normal contour, no lesions. Normal discharge.  Positive pooling.   Cervical exam:  Dilation: 2.5 Effacement (%): 70 Station: -3 Presentation: Vertex Exam by:: Judee Hennick    Fetal Monitoring: Baseline: 135 Variability: moderate Accelerations: 15x15 Decelerations: none Contractions: Q8-12 mins & UI  Results for orders placed or performed during the hospital encounter of 06/29/18 (from the past 24 hour(s))  Fern Test     Status: None   Collection Time: 06/29/18 11:02 AM  Result Value Ref Range   POCT Fern Test Negative = intact amniotic membranes   Fern Test     Status: None   Collection Time: 06/29/18 11:18 AM  Result Value Ref Range   POCT Fern Test Positive = ruptured amniotic membanes      A: SIUP at 4130w3d  SROM  P: RN to call MD for admission orders  Judeth HornLawrence, Valor Quaintance, NP 06/29/2018 11:24 AM

## 2018-06-29 NOTE — Progress Notes (Signed)
Phyllis Farmer is a 22 y.o. G1P0 at 4247w3d  Subjective: Comfortable with epidural.  No complaints.  Objective: BP (!) 118/53   Pulse 92   Temp 98.1 F (36.7 C) (Oral)   Resp 18   Wt 74.4 kg   LMP 09/19/2017   SpO2 99%   BMI 28.15 kg/m  No intake/output data recorded. Total I/O In: -  Out: 150 [Urine:150]  FHT:  FHR: 125 bpm, variability: moderate,  accelerations:  Present,  decelerations:  Absent UC:   Not picking up well SVE:   Dilation: 5.5 Effacement (%): 100 Station: -1 Exam by:: Enis SlipperJane Bailey, RN  Labs: Lab Results  Component Value Date   WBC 9.0 06/29/2018   HGB 9.7 (L) 06/29/2018   HCT 30.8 (L) 06/29/2018   MCV 83.0 06/29/2018   PLT 278 06/29/2018    Assessment / Plan: Augmentation of labor, progressing well  Labor: progressing normally, continue to titrate pitocin as indicated Preeclampsia:  no signs or symptoms of toxicity Fetal Wellbeing:  Category I Pain Control:  Epidural I/D:  GBS + on PCN Anticipated MOD:  NSVD  Phyllis Farmer 06/29/2018, 6:50 PM

## 2018-06-29 NOTE — MAU Note (Signed)
Started leaking about an hour ago, clear fluid, still coming.  No bleeding. Some ctx's- mild.

## 2018-06-29 NOTE — Anesthesia Preprocedure Evaluation (Signed)
Anesthesia Evaluation  Patient identified by MRN, date of birth, ID band Patient awake    Reviewed: Allergy & Precautions, NPO status , Patient's Chart, lab work & pertinent test results  Airway Mallampati: II  TM Distance: >3 FB Neck ROM: Full    Dental no notable dental hx. (+) Teeth Intact   Pulmonary neg pulmonary ROS,    Pulmonary exam normal breath sounds clear to auscultation       Cardiovascular negative cardio ROS Normal cardiovascular exam Rhythm:Regular Rate:Normal     Neuro/Psych negative neurological ROS  negative psych ROS   GI/Hepatic   Endo/Other    Renal/GU      Musculoskeletal   Abdominal   Peds  Hematology   Anesthesia Other Findings   Reproductive/Obstetrics (+) Pregnancy                             Lab Results  Component Value Date   WBC 9.0 06/29/2018   HGB 9.7 (L) 06/29/2018   HCT 30.8 (L) 06/29/2018   MCV 83.0 06/29/2018   PLT 278 06/29/2018    Anesthesia Physical Anesthesia Plan  ASA: II  Anesthesia Plan: Epidural   Post-op Pain Management:    Induction:   PONV Risk Score and Plan:   Airway Management Planned:   Additional Equipment:   Intra-op Plan:   Post-operative Plan:   Informed Consent: I have reviewed the patients History and Physical, chart, labs and discussed the procedure including the risks, benefits and alternatives for the proposed anesthesia with the patient or authorized representative who has indicated his/her understanding and acceptance.     Plan Discussed with:   Anesthesia Plan Comments:         Anesthesia Quick Evaluation

## 2018-06-29 NOTE — Progress Notes (Signed)
Phyllis Farmer is a 22 y.o. G1P0 at 1056w3d admitted for rupture of membranes  Subjective: Patient is comfortable and reports she is feeling pressure in her bottom and has been feeling this way all day. On exam, forebag was palpable and it ruptured from cervical exam. Assisted patient into high fowler's position to encourage fetal descent. Will recheck and if cervix is still unchanged, will place IUPC.   Objective: Vitals:   06/29/18 2131 06/29/18 2201 06/29/18 2226 06/29/18 2231  BP: 118/63 138/60 125/60 129/71  Pulse: (!) 101 94 93 96  Resp:      Temp:      TempSrc:      SpO2:      Weight:       FHT:  FHR: 130s bpm, variability: moderate,  accelerations:  Present,  decelerations:  Absent UC:   irregular, every 1-3 minutes with some intermittent uterine irritability SVE:   Dilation: 5.5 Effacement (%): 90 Station: -2 Exam by:: CNM Salima Rumer  Labs: Lab Results  Component Value Date   WBC 9.0 06/29/2018   HGB 9.7 (L) 06/29/2018   HCT 30.8 (L) 06/29/2018   MCV 83.0 06/29/2018   PLT 278 06/29/2018    Assessment / Plan: Augmentation of labor, progressing well  Labor: Progressing normally Preeclampsia:  no signs or symptoms of toxicity, intake and ouput balanced and labs stable Fetal Wellbeing:  Category I Pain Control:  Epidural I/D:  n/a Anticipated MOD:  NSVD  Janeece Riggersllis K Roi Jafari 06/29/2018, 10:39 PM

## 2018-06-29 NOTE — H&P (Addendum)
Phyllis Farmer is a 22 y.o. female presenting for complaints of leaking fluid at 8am and found by MAU staff to be ruptured and 2-3/70/-3.  Denies VB, reports good FM and ctxs. OB History    Gravida  1   Para      Term      Preterm      AB      Living        SAB      TAB      Ectopic      Multiple      Live Births             Past Medical History:  Diagnosis Date  . Infection    UTI  . Medical history non-contributory    Past Surgical History:  Procedure Laterality Date  . NO PAST SURGERIES     Family History: family history includes Healthy in her mother; Heart disease (age of onset: 5655) in her father. Social History:  reports that she has never smoked. She has never used smokeless tobacco. She reports that she drank alcohol. She reports that she has current or past drug history. Drug: Marijuana.     Maternal Diabetes: No Genetic Screening: Normal Maternal Ultrasounds/Referrals: Normal Fetal Ultrasounds or other Referrals:  Fetal echo wnl Maternal Substance Abuse:  No Significant Maternal Medications:  None Significant Maternal Lab Results:  Lab values include: Group B Strep positive Other Comments:  Reports alcohol and MJ use per social history above although I do not see any mention of this in her PN chart.  She is rubella NI and used vaginal progesterone during the pregnancy for cervical funneling early in the pregnancy.  ROS  Denies F/C/N/V/D  History Dilation: 2.5 Effacement (%): 70 Station: -3 Exam by:: Lawrence  Blood pressure 128/70, pulse (!) 103, temperature 98.7 F (37.1 C), temperature source Oral, resp. rate 17, weight 74.4 kg, last menstrual period 09/19/2017. Exam Physical Exam  Lungs CTA CV RRR Abd gravid, NT Ext no calf tenderness FHT cat 1  Prenatal labs: ABO, Rh:  AB positive Antibody:  Negative Rubella:  Non-immune RPR:   Nonreactive HBsAg:   Nonreactive HIV:   Nonreactive GBS:   Positive  Assessment/Plan: P0 at 3340  3/7wks admitted with SROM and contractions.  Will augment with pitocin if not progressing.  PCN for GBS +.  Fetal status is cat 1.  Phyllis Farmer 06/29/2018, 12:00 PM   1305 addendum exam 2-3/80/-2/vtx.  Cat 1 tracing.  Ctxs irregular.  I discussed options and recs with pt and she is agreeable to start pitocin.  PCN already started.

## 2018-06-29 NOTE — MAU Note (Signed)
Neg fern, will proceed with spec exam

## 2018-06-30 ENCOUNTER — Encounter (HOSPITAL_COMMUNITY): Payer: Self-pay | Admitting: *Deleted

## 2018-06-30 LAB — CBC WITH DIFFERENTIAL/PLATELET
Basophils Absolute: 0 10*3/uL (ref 0.0–0.1)
Basophils Relative: 0 %
Eosinophils Absolute: 0 10*3/uL (ref 0.0–0.5)
Eosinophils Relative: 0 %
HCT: 30.1 % — ABNORMAL LOW (ref 36.0–46.0)
Hemoglobin: 9.6 g/dL — ABNORMAL LOW (ref 12.0–15.0)
Lymphocytes Relative: 16 %
Lymphs Abs: 1.8 10*3/uL (ref 0.7–4.0)
MCH: 26.1 pg (ref 26.0–34.0)
MCHC: 31.9 g/dL (ref 30.0–36.0)
MCV: 81.8 fL (ref 80.0–100.0)
Monocytes Absolute: 0.7 10*3/uL (ref 0.1–1.0)
Monocytes Relative: 6 %
Neutro Abs: 8.4 10*3/uL — ABNORMAL HIGH (ref 1.7–7.7)
Neutrophils Relative %: 78 %
Platelets: 244 10*3/uL (ref 150–400)
RBC: 3.68 MIL/uL — ABNORMAL LOW (ref 3.87–5.11)
RDW: 14.6 % (ref 11.5–15.5)
WBC: 10.8 10*3/uL — ABNORMAL HIGH (ref 4.0–10.5)
nRBC: 0 % (ref 0.0–0.2)

## 2018-06-30 LAB — COMPREHENSIVE METABOLIC PANEL
ALT: 13 U/L (ref 0–44)
AST: 21 U/L (ref 15–41)
Albumin: 2.9 g/dL — ABNORMAL LOW (ref 3.5–5.0)
Alkaline Phosphatase: 160 U/L — ABNORMAL HIGH (ref 38–126)
Anion gap: 9 (ref 5–15)
BUN: 7 mg/dL (ref 6–20)
CO2: 20 mmol/L — ABNORMAL LOW (ref 22–32)
Calcium: 8.9 mg/dL (ref 8.9–10.3)
Chloride: 106 mmol/L (ref 98–111)
Creatinine, Ser: 0.69 mg/dL (ref 0.44–1.00)
GFR calc Af Amer: >60 mL/min (ref >60)
GFR calc non Af Amer: >60 mL/min (ref >60)
Glucose, Bld: 87 mg/dL (ref 70–99)
Potassium: 3.7 mmol/L (ref 3.5–5.1)
Sodium: 135 mmol/L (ref 135–145)
Total Bilirubin: 1.4 mg/dL — ABNORMAL HIGH (ref 0.3–1.2)
Total Protein: 6.9 g/dL (ref 6.5–8.1)

## 2018-06-30 LAB — RPR: RPR Ser Ql: NONREACTIVE

## 2018-06-30 LAB — PROTEIN / CREATININE RATIO, URINE
Creatinine, Urine: 109 mg/dL
Protein Creatinine Ratio: 0.38 mg/mg{Cre} — ABNORMAL HIGH (ref 0.00–0.15)
Total Protein, Urine: 41 mg/dL

## 2018-06-30 LAB — LACTATE DEHYDROGENASE: LDH: 120 U/L (ref 98–192)

## 2018-06-30 LAB — URIC ACID: Uric Acid, Serum: 4.3 mg/dL (ref 2.5–7.1)

## 2018-06-30 MED ORDER — BENZOCAINE-MENTHOL 20-0.5 % EX AERO
1.0000 "application " | INHALATION_SPRAY | CUTANEOUS | Status: DC | PRN
  Administered 2018-06-30: 1 via TOPICAL
  Filled 2018-06-30: qty 56

## 2018-06-30 MED ORDER — ONDANSETRON HCL 4 MG/2ML IJ SOLN: 4.0000 mg | INTRAMUSCULAR | Status: DC | PRN

## 2018-06-30 MED ORDER — ACETAMINOPHEN 325 MG PO TABS: 650.0000 mg | ORAL_TABLET | ORAL | Status: DC | PRN

## 2018-06-30 MED ORDER — OXYCODONE-ACETAMINOPHEN 5-325 MG PO TABS: 2.0000 | ORAL_TABLET | ORAL | Status: DC | PRN

## 2018-06-30 MED ORDER — DIPHENHYDRAMINE HCL 25 MG PO CAPS: 25.0000 mg | ORAL_CAPSULE | Freq: Four times a day (QID) | ORAL | Status: DC | PRN

## 2018-06-30 MED ORDER — ONDANSETRON HCL 4 MG PO TABS: 4.0000 mg | ORAL_TABLET | ORAL | Status: DC | PRN

## 2018-06-30 MED ORDER — COCONUT OIL OIL: 1.0000 "application " | TOPICAL_OIL | Status: DC | PRN

## 2018-06-30 MED ORDER — DIBUCAINE 1 % RE OINT: 1.0000 "application " | TOPICAL_OINTMENT | RECTAL | Status: DC | PRN

## 2018-06-30 MED ORDER — PRENATAL MULTIVITAMIN CH
1.0000 | ORAL_TABLET | Freq: Every day | ORAL | Status: DC
  Administered 2018-07-01 – 2018-07-02 (×2): 1 via ORAL
  Filled 2018-06-30 (×2): qty 1

## 2018-06-30 MED ORDER — MEASLES, MUMPS & RUBELLA VAC IJ SOLR: 0.5000 mL | Freq: Once | INTRAMUSCULAR | Status: DC

## 2018-06-30 MED ORDER — SENNOSIDES-DOCUSATE SODIUM 8.6-50 MG PO TABS
2.0000 | ORAL_TABLET | ORAL | Status: DC
  Administered 2018-06-30: 2 via ORAL
  Filled 2018-06-30 (×2): qty 2

## 2018-06-30 MED ORDER — OXYCODONE-ACETAMINOPHEN 5-325 MG PO TABS: 1.0000 | ORAL_TABLET | ORAL | Status: DC | PRN

## 2018-06-30 MED ORDER — ZOLPIDEM TARTRATE 5 MG PO TABS: 5.0000 mg | ORAL_TABLET | Freq: Every evening | ORAL | Status: DC | PRN

## 2018-06-30 MED ORDER — SIMETHICONE 80 MG PO CHEW: 80.0000 mg | CHEWABLE_TABLET | ORAL | Status: DC | PRN

## 2018-06-30 MED ORDER — WITCH HAZEL-GLYCERIN EX PADS: 1.0000 "application " | MEDICATED_PAD | CUTANEOUS | Status: DC | PRN

## 2018-06-30 MED ORDER — IBUPROFEN 600 MG PO TABS
600.0000 mg | ORAL_TABLET | Freq: Four times a day (QID) | ORAL | Status: DC
  Administered 2018-06-30 – 2018-07-02 (×7): 600 mg via ORAL
  Filled 2018-06-30 (×7): qty 1

## 2018-06-30 MED ORDER — TETANUS-DIPHTH-ACELL PERTUSSIS 5-2.5-18.5 LF-MCG/0.5 IM SUSP: 0.5000 mL | Freq: Once | INTRAMUSCULAR | Status: DC

## 2018-06-30 MED ORDER — LACTATED RINGERS IV SOLN: INTRAVENOUS | Status: DC

## 2018-06-30 NOTE — Anesthesia Postprocedure Evaluation (Signed)
Anesthesia Post Note  Patient: Aalani Panepinto  Procedure(s) Performed: AN AD HOC LABOR EPIDURAL     Patient location during evaluation: Mother Baby Anesthesia Type: Epidural Level of consciousness: awake and alert and oriented Pain management: satisfactory to patient Vital Signs Assessment: post-procedure vital signs reviewed and stable Respiratory status: respiratory function stable Cardiovascular status: stable Postop Assessment: no headache, no backache, epidural receding, patient able to bend at knees, no signs of nausea or vomiting and adequate PO intake Anesthetic complications: no    Last Vitals:  Vitals:   06/30/18 1430 06/30/18 1530  BP: 137/68 (!) 146/85  Pulse: 88   Resp: 18 18  Temp: 37.7 C 36.8 C  SpO2: 100%     Last Pain:  Vitals:   06/30/18 1430  TempSrc: Oral  PainSc: 1    Pain Goal: Patients Stated Pain Goal: 4 (06/30/18 1430)               Clever Geraldo

## 2018-06-30 NOTE — Lactation Note (Signed)
This note was copied from a baby's chart. Lactation Consultation Note  Patient Name: Phyllis Farmer ZOXWR'UToday's Date: 06/30/2018 Reason for consult: Initial assessment;Term;Primapara;1st time breastfeeding  P1 mother whose infant is now 394 hours old.    Baby was sleeping in visitor's arms when I arrived.  Encouraged feeding 8-12 times/24 hours or sooner if baby shows cues.  Mother was familiar with hand expression and feeding cues.  Colostrum container provided for any EBM she may obtain with hand expression.  Finger feeding demonstrated.  Mother asked if she could begin pumping.  When I questioned her about her feeding goal she stated that she wanted father to help feed.  I suggested that she focus on breast feeding while she is in the hospital to help establish milk supply and to practice breast feeding while she has assistance.  Mother agreed to this plan.  She will exclusively breast feed for at least 2 weeks and then begin post pumping at home for father to feed.  She has a DEBP for home use.  Mom made aware of O/P services, breastfeeding support groups, community resources, and our phone # for post-discharge questions. Many visitors in room and mother had no further questions/concerns.  Encouraged her to call her RN/LC for latch assistance as needed.   Maternal Data Formula Feeding for Exclusion: No Has patient been taught Hand Expression?: Yes Does the patient have breastfeeding experience prior to this delivery?: No  Feeding Feeding Type: Breast Fed  LATCH Score Latch: Repeated attempts needed to sustain latch, nipple held in mouth throughout feeding, stimulation needed to elicit sucking reflex.  Audible Swallowing: A few with stimulation  Type of Nipple: Flat  Comfort (Breast/Nipple): Soft / non-tender  Hold (Positioning): Assistance needed to correctly position infant at breast and maintain latch.  LATCH Score: 6  Interventions Interventions: Breast feeding basics  reviewed;Assisted with latch;Skin to skin;Breast compression;Adjust position;Support pillows;Position options  Lactation Tools Discussed/Used WIC Program: No   Consult Status Consult Status: Follow-up Date: 07/01/18 Follow-up type: In-patient    Dora SimsBeth R Jermale Crass 06/30/2018, 4:34 PM

## 2018-06-30 NOTE — Progress Notes (Addendum)
Virl SonBrianna Pociask is a 22 y.o. G1P0 at 9539w3d admitted for rupture of membranes  Subjective: Patient's exam is unchanged, discussed IUPC and patient consented to placement. MVUs are borderline adequate so RN to increase the pitocin per protocol to ensure MVUs are remain adequate.  Objective: Vitals:   06/30/18 0001 06/30/18 0028 06/30/18 0031 06/30/18 0101  BP: 135/66  139/73 138/81  Pulse: 86  90 100  Resp: 16  18 18   Temp:  97.9 F (36.6 C)    TempSrc:  Oral    SpO2:      Weight:       FHT:  FHR: 130 bpm, variability: moderate,  accelerations:  Present,  decelerations:  Present occasional mild variable decels with more frequent early decels UC:   irregular, every 1-3 minutes  SVE:   Dilation: 5.5 Effacement (%): 90 Station: -2 Exam by:: CNM CDW CorporationEllis  Labs: Lab Results  Component Value Date   WBC 9.0 06/29/2018   HGB 9.7 (L) 06/29/2018   HCT 30.8 (L) 06/29/2018   MCV 83.0 06/29/2018   PLT 278 06/29/2018    Assessment / Plan: Augmentation of labor, progressing well  Labor: Progressing normally Preeclampsia:  no signs or symptoms of toxicity, intake and ouput balanced and labs stable Fetal Wellbeing:  Category II but overall reassuring Pain Control:  Epidural I/D:  n/a Anticipated MOD:  NSVD  Janeece Riggersllis K Lancer Thurner 06/30/2018, 1:21 AM

## 2018-06-30 NOTE — Progress Notes (Signed)
Phyllis Farmer is a 22 y.o. G1P0 at 1578w3d admitted for rupture of membranes  Subjective: Patient is reporting increased rectal pressure with contractions. She has had elevated blood pressures several times while in labor, some of which have been borderline. Patient asymptomatic but PIH labs ordered. Patient having tachysystole but fetal heart rate reassuring. Reducing pitocin from 4519mu/min to 15 mu/min to help space out contractions. Will continue to monitor and adjust pitocin as needed.   Objective: Vitals:   06/30/18 0331 06/30/18 0401 06/30/18 0420 06/30/18 0431  BP: (!) 111/51 (!) 121/51  135/79  Pulse: (!) 102 (!) 106  (!) 103  Resp: 18 18    Temp:   98.2 F (36.8 C)   TempSrc:   Oral   SpO2:      Weight:       FHT:  FHR: 130 bpm, variability: moderate,  accelerations:  Present,  decelerations:  Present occasional mild variable decels with more frequent early decels UC:   Regular every 1-602min, tachyststole  SVE:   Dilation: 7 Effacement (%): 100 Station: -1 Exam by:: CNM [employer]Ellis  Labs: Lab Results  Component Value Date   WBC 9.0 06/29/2018   HGB 9.7 (L) 06/29/2018   HCT 30.8 (L) 06/29/2018   MCV 83.0 06/29/2018   PLT 278 06/29/2018    Assessment / Plan: Augmentation of labor, progressing well  Labor: Progressing normally Preeclampsia:  no signs or symptoms of toxicity, intake and ouput balanced and labs stable Fetal Wellbeing:  Category I  Pain Control:  Epidural I/D:  n/a Anticipated MOD:  NSVD  Phyllis Farmer K Bill Mcvey 06/30/2018, 5:00 AM

## 2018-07-01 LAB — CBC
HCT: 27.3 % — ABNORMAL LOW (ref 36.0–46.0)
Hemoglobin: 8.3 g/dL — ABNORMAL LOW (ref 12.0–15.0)
MCH: 25.2 pg — ABNORMAL LOW (ref 26.0–34.0)
MCHC: 30.4 g/dL (ref 30.0–36.0)
MCV: 82.7 fL (ref 80.0–100.0)
Platelets: 197 10*3/uL (ref 150–400)
RBC: 3.3 MIL/uL — ABNORMAL LOW (ref 3.87–5.11)
RDW: 14.7 % (ref 11.5–15.5)
WBC: 13.5 10*3/uL — ABNORMAL HIGH (ref 4.0–10.5)
nRBC: 0 % (ref 0.0–0.2)

## 2018-07-01 NOTE — Lactation Note (Signed)
This note was copied from a baby's chart. Lactation Consultation Note: Mom reports baby has been nursing well on the left breast but has not latched to right breast. Getting her bath at present. When finished assisted mom with latch in football hold. Baby took a couple of attempts then latched well and still nursing as I left room. Mom reports this is the best she has done on that breast. Encouraged mom to hold breast throughout feeding for good support. Mom reports no pan with latch, only tugging. No questions at present. To call for assist prn  Patient Name: Phyllis Farmer ZOXWR'UToday's Date: 07/01/2018 Reason for consult: Follow-up assessment   Maternal Data Formula Feeding for Exclusion: No Has patient been taught Hand Expression?: Yes  Feeding Feeding Type: Breast Fed  LATCH Score Latch: Grasps breast easily, tongue down, lips flanged, rhythmical sucking.  Audible Swallowing: A few with stimulation  Type of Nipple: Everted at rest and after stimulation  Comfort (Breast/Nipple): Soft / non-tender  Hold (Positioning): Assistance needed to correctly position infant at breast and maintain latch.  LATCH Score: 8  Interventions Interventions: Breast feeding basics reviewed;Position options;Assisted with latch;Support pillows;Hand express  Lactation Tools Discussed/Used     Consult Status Consult Status: Follow-up Date: 07/02/18 Follow-up type: In-patient    Pamelia HoitWeeks, Eugena Rhue D 07/01/2018, 10:54 AM

## 2018-07-01 NOTE — Progress Notes (Signed)
Subjective: Postpartum Day # 2 : S/P NSVD due to spontaneous SROM with cxt in latent labor. Patient up ad lib, denies syncope or dizziness. Reports consuming regular diet without issues and denies N/V. Patient reports 0 bowel movement + passing flatus.  Denies issues with urination and reports bleeding is "lighter."  Patient is breastfeeding and reports going well.  Desires nexplanon for postpartum contraception.  Pain is being appropriately managed with use of motrin. Post deliver hgb was 9.7, pt denies anemia s/sx.   no laceration Feeding:  Breast Contraceptive plan:  nexplanon Baby Female.  Named Kimari  Objective: Vital signs in last 24 hours: Patient Vitals for the past 24 hrs:  BP Temp Temp src Pulse Resp SpO2  07/01/18 0523 117/82 97.7 F (36.5 C) Oral 69 16 100 %  07/01/18 0323 117/88 97.7 F (36.5 C) Oral 71 16 94 %  06/30/18 2330 (!) 124/57 98.3 F (36.8 C) Oral 80 18 98 %  06/30/18 1930 124/66 98.3 F (36.8 C) Oral 79 18 99 %  06/30/18 1530 (!) 146/85 98.2 F (36.8 C) - - 18 -  06/30/18 1430 137/68 99.8 F (37.7 C) Oral 88 18 100 %  06/30/18 1358 - - - - 20 -  06/30/18 1352 119/76 - - 91 18 -  06/30/18 1347 (!) 89/65 - - 96 20 -  06/30/18 1331 134/75 - - (!) 102 18 -  06/30/18 1316 135/79 99.5 F (37.5 C) Axillary (!) 115 18 -  06/30/18 1303 125/64 - - (!) 102 - -  06/30/18 1301 (!) 149/98 - - (!) 110 18 -  06/30/18 1248 130/67 - - (!) 119 20 -  06/30/18 1231 (!) 152/115 - - - 18 -  06/30/18 1201 139/80 - - (!) 108 18 -     Physical Exam:  General: alert, cooperative, appears stated age and no distress Mood/Affect: Happy Lungs: clear to auscultation, no wheezes, rales or rhonchi, symmetric air entry.  Heart: normal rate, regular rhythm, normal S1, S2, no murmurs, rubs, clicks or gallops. Breast: breasts appear normal, no suspicious masses, no skin or nipple changes or axillary nodes. Abdomen:  + bowel sounds, soft, non-tender GU: perineum intact, healing well.  No signs of external hematomas.  Uterine Fundus: firm Lochia: appropriate Skin: Warm, Dry. DVT Evaluation: No evidence of DVT seen on physical exam. Negative Homan's sign. No cords or calf tenderness. No significant calf/ankle edema.  CBC Latest Ref Rng & Units 07/01/2018 06/30/2018 06/29/2018  WBC 4.0 - 10.5 K/uL 13.5(H) 10.8(H) 9.0  Hemoglobin 12.0 - 15.0 g/dL 8.3(L) 9.6(L) 9.7(L)  Hematocrit 36.0 - 46.0 % 27.3(L) 30.1(L) 30.8(L)  Platelets 150 - 400 K/uL 197 244 278    Results for orders placed or performed during the hospital encounter of 06/29/18 (from the past 24 hour(s))  CBC     Status: Abnormal   Collection Time: 07/01/18  5:26 AM  Result Value Ref Range   WBC 13.5 (H) 4.0 - 10.5 K/uL   RBC 3.30 (L) 3.87 - 5.11 MIL/uL   Hemoglobin 8.3 (L) 12.0 - 15.0 g/dL   HCT 69.6 (L) 29.5 - 28.4 %   MCV 82.7 80.0 - 100.0 fL   MCH 25.2 (L) 26.0 - 34.0 pg   MCHC 30.4 30.0 - 36.0 g/dL   RDW 13.2 44.0 - 10.2 %   Platelets 197 150 - 400 K/uL   nRBC 0.0 0.0 - 0.2 %     CBG (last 3)  No results for input(s): GLUCAP in  the last 72 hours.   I/O last 3 completed shifts: In: -  Out: 838 [Urine:750; Blood:88]   Assessment Postpartum Day # 2 : S/P NSVD due to SROM with cxt in latent labor. Pt stable. -1 involution. Breastfeeding. Hemodynamically stable. Post delivery hgb 9.7, asymptomatic.   Plan: Continue other mgmt as ordered VTE prophylactics: Early ambulated as tolerates.  Pain control: Motrin/Tylenol PRN Anemia: Started on IROn.  Education given regarding options for contraception, including injectable contraception.  Plan for discharge tomorrow, Breastfeeding, Lactation consult and Contraception Nexplanon Dr. Estanislado Pandyivard to be updated on patient status  Seyed Heffley NP-C, CNM 07/01/2018, 12:00 PM

## 2018-07-02 DIAGNOSIS — O1490 Unspecified pre-eclampsia, unspecified trimester: Secondary | ICD-10-CM | POA: Diagnosis present

## 2018-07-02 MED ORDER — IBUPROFEN 600 MG PO TABS: 600.0000 mg | ORAL_TABLET | Freq: Four times a day (QID) | ORAL | 0 refills | Status: AC | PRN

## 2018-07-02 MED ORDER — FERROUS SULFATE 325 (65 FE) MG PO TABS: 325.0000 mg | ORAL_TABLET | Freq: Every day | ORAL | 2 refills | Status: AC

## 2018-07-02 NOTE — Discharge Instructions (Signed)
Postpartum Care After Vaginal Delivery ° °The period of time right after you deliver your newborn is called the postpartum period. °What kind of medical care will I receive? °· You may continue to receive fluids and medicines through an IV tube inserted into one of your veins. °· If an incision was made near your vagina (episiotomy) or if you had some vaginal tearing during delivery, cold compresses may be placed on your episiotomy or your tear. This helps to reduce pain and swelling. °· You may be given a squirt bottle to use when you go to the bathroom. You may use this until you are comfortable wiping as usual. To use the squirt bottle, follow these steps: °? Before you urinate, fill the squirt bottle with warm water. Do not use hot water. °? After you urinate, while you are sitting on the toilet, use the squirt bottle to rinse the area around your urethra and vaginal opening. This rinses away any urine and blood. °? You may do this instead of wiping. As you start healing, you may use the squirt bottle before wiping yourself. Make sure to wipe gently. °? Fill the squirt bottle with clean water every time you use the bathroom. °· You will be given sanitary pads to wear. °How can I expect to feel? °· You may not feel the need to urinate for several hours after delivery. °· You will have some soreness and pain in your abdomen and vagina. °· If you are breastfeeding, you may have uterine contractions every time you breastfeed for up to several weeks postpartum. Uterine contractions help your uterus return to its normal size. °· It is normal to have vaginal bleeding (lochia) after delivery. The amount and appearance of lochia is often similar to a menstrual period in the first week after delivery. It will gradually decrease over the next few weeks to a dry, yellow-brown discharge. For most women, lochia stops completely by 6-8 weeks after delivery. Vaginal bleeding can vary from woman to woman. °· Within the first few  days after delivery, you may have breast engorgement. This is when your breasts feel heavy, full, and uncomfortable. Your breasts may also throb and feel hard, tightly stretched, warm, and tender. After this occurs, you may have milk leaking from your breasts. Your health care provider can help you relieve discomfort due to breast engorgement. Breast engorgement should go away within a few days. °· You may feel more sad or worried than normal due to hormonal changes after delivery. These feelings should not last more than a few days. If these feelings do not go away after several days, speak with your health care provider. °How should I care for myself? °· Tell your health care provider if you have pain or discomfort. °· Drink enough water to keep your urine clear or pale yellow. °· Wash your hands thoroughly with soap and water for at least 20 seconds after changing your sanitary pads, after using the toilet, and before holding or feeding your baby. °· If you are not breastfeeding, avoid touching your breasts a lot. Doing this can make your breasts produce more milk. °· If you become weak or lightheaded, or you feel like you might faint, ask for help before: °? Getting out of bed. °? Showering. °· Change your sanitary pads frequently. Watch for any changes in your flow, such as a sudden increase in volume, a change in color, the passing of large blood clots. If you pass a blood clot from your vagina,   save it to show to your health care provider. Do not flush blood clots down the toilet without having your health care provider look at them.  Make sure that all your vaccinations are up to date. This can help protect you and your baby from getting certain diseases. You may need to have immunizations done before you leave the hospital.  If desired, talk with your health care provider about methods of family planning or birth control (contraception). How can I start bonding with my baby? Spending as much time as  possible with your baby is very important. During this time, you and your baby can get to know each other and develop a bond. Having your baby stay with you in your room (rooming in) can give you time to get to know your baby. Rooming in can also help you become comfortable caring for your baby. Breastfeeding can also help you bond with your baby. How can I plan for returning home with my baby?  Make sure that you have a car seat installed in your vehicle. ? Your car seat should be checked by a certified car seat installer to make sure that it is installed safely. ? Make sure that your baby fits into the car seat safely.  Ask your health care provider any questions you have about caring for yourself or your baby. Make sure that you are able to contact your health care provider with any questions after leaving the hospital. This information is not intended to replace advice given to you by your health care provider. Make sure you discuss any questions you have with your health care provider. Document Released: 05/12/2007 Document Revised: 12/18/2015 Document Reviewed: 06/19/2015 Elsevier Interactive Patient Education  2018 [employer].   Postpartum Depression and Baby Blues The postpartum period begins right after the birth of a baby. During this time, there is often a great amount of joy and excitement. It is also a time of many changes in the life of the parents. Regardless of how many times a mother gives birth, each child brings new challenges and dynamics to the family. It is not unusual to have feelings of excitement along with confusing shifts in moods, emotions, and thoughts. All mothers are at risk of developing postpartum depression or the "baby blues." These mood changes can occur right after giving birth, or they may occur many months after giving birth. The baby blues or postpartum depression can be mild or severe. Additionally, postpartum depression can go away rather quickly, or it can  be a long-term condition. What are the causes? Raised hormone levels and the rapid drop in those levels are thought to be a main cause of postpartum depression and the baby blues. A number of hormones change during and after pregnancy. Estrogen and progesterone usually decrease right after the delivery of your baby. The levels of thyroid hormone and various cortisol steroids also rapidly drop. Other factors that play a role in these mood changes include major life events and genetics. What increases the risk? If you have any of the following risks for the baby blues or postpartum depression, know what symptoms to watch out for during the postpartum period. Risk factors that may increase the likelihood of getting the baby blues or postpartum depression include:  Having a personal or family history of depression.  Having depression while being pregnant.  Having premenstrual mood issues or mood issues related to oral contraceptives.  Having a lot of life stress.  Having marital conflict.  Lacking  a social support network.  Having a baby with special needs.  Having health problems, such as diabetes.  What are the signs or symptoms? Symptoms of baby blues include:  Brief changes in mood, such as going from extreme happiness to sadness.  Decreased concentration.  Difficulty sleeping.  Crying spells, tearfulness.  Irritability.  Anxiety.  Symptoms of postpartum depression typically begin within the first month after giving birth. These symptoms include:  Difficulty sleeping or excessive sleepiness.  Marked weight loss.  Agitation.  Feelings of worthlessness.  Lack of interest in activity or food.  Postpartum psychosis is a very serious condition and can be dangerous. Fortunately, it is rare. Displaying any of the following symptoms is cause for immediate medical attention. Symptoms of postpartum psychosis include:  Hallucinations and delusions.  Bizarre or disorganized  behavior.  Confusion or disorientation.  How is this diagnosed? A diagnosis is made by an evaluation of your symptoms. There are no medical or lab tests that lead to a diagnosis, but there are various questionnaires that a health care provider may use to identify those with the baby blues, postpartum depression, or psychosis. Often, a screening tool called the [place] Postnatal Depression Scale is used to diagnose depression in the postpartum period. How is this treated? The baby blues usually goes away on its own in 1-2 weeks. Social support is often all that is needed. You will be encouraged to get adequate sleep and rest. Occasionally, you may be given medicines to help you sleep. Postpartum depression requires treatment because it can last several months or longer if it is not treated. Treatment may include individual or group therapy, medicine, or both to address any social, physiological, and psychological factors that may play a role in the depression. Regular exercise, a healthy diet, rest, and social support may also be strongly recommended. Postpartum psychosis is more serious and needs treatment right away. Hospitalization is often needed. Follow these instructions at home:  Get as much rest as you can. Nap when the baby sleeps.  Exercise regularly. Some women find yoga and walking to be beneficial.  Eat a balanced and nourishing diet.  Do little things that you enjoy. Have a cup of tea, take a bubble bath, read your favorite magazine, or listen to your favorite music.  Avoid alcohol.  Ask for help with household chores, cooking, grocery shopping, or running errands as needed. Do not try to do everything.  Talk to people close to you about how you are feeling. Get support from your partner, family members, friends, or other new moms.  Try to stay positive in how you think. Think about the things you are grateful for.  Do not spend a lot of time alone.  Only take  over-the-counter or prescription medicine as directed by your health care provider.  Keep all your postpartum appointments.  Let your health care provider know if you have any concerns. Contact a health care provider if: You are having a reaction to or problems with your medicine. Get help right away if:  You have suicidal feelings.  You think you may harm the baby or someone else. This information is not intended to replace advice given to you by your health care provider. Make sure you discuss any questions you have with your health care provider. Document Released: 04/18/2004 Document Revised: 12/21/2015 Document Reviewed: 04/26/2013 Elsevier Interactive Patient Education  2017 [employer]   Preeclampsia and Eclampsia Preeclampsia is a serious condition that develops only during pregnancy. It is  also called toxemia of pregnancy. This condition causes high blood pressure along with other symptoms, such as swelling and headaches. These symptoms may develop as the condition gets worse. Preeclampsia may occur at 20 weeks of pregnancy or later. Diagnosing and treating preeclampsia early is very important. If not treated early, it can cause serious problems for you and your baby. One problem it can lead to is eclampsia, which is a condition that causes muscle jerking or shaking (convulsions or seizures) in the mother. Delivering your baby is the best treatment for preeclampsia or eclampsia. Preeclampsia and eclampsia symptoms usually go away after your baby is born. What are the causes? The cause of preeclampsia is not known. What increases the risk? The following risk factors make you more likely to develop preeclampsia:  Being pregnant for the first time.  Having had preeclampsia during a past pregnancy.  Having a family history of preeclampsia.  Having high blood pressure.  Being pregnant with twins or triplets.  Being 5835 or older.  Being African-American.  Having kidney  disease or diabetes.  Having medical conditions such as lupus or blood diseases.  Being very overweight (obese).  What are the signs or symptoms? The earliest signs of preeclampsia are:  High blood pressure.  Increased protein in your urine. Your health care provider will check for this at every visit before you give birth (prenatal visit).  Other symptoms that may develop as the condition gets worse include:  Severe headaches.  Sudden weight gain.  Swelling of the hands, face, legs, and feet.  Nausea and vomiting.  Vision problems, such as blurred or double vision.  Numbness in the face, arms, legs, and feet.  Urinating less than usual.  Dizziness.  Slurred speech.  Abdominal pain, especially upper abdominal pain.  Convulsions or seizures.  Symptoms generally go away after giving birth. How is this diagnosed? There are no screening tests for preeclampsia. Your health care provider will ask you about symptoms and check for signs of preeclampsia during your prenatal visits. You may also have tests that include:  Urine tests.  Blood tests.  Checking your blood pressure.  Monitoring your babys heart rate.  Ultrasound.  How is this treated? You and your health care provider will determine the treatment approach that is best for you. Treatment may include:  Having more frequent prenatal exams to check for signs of preeclampsia, if you have an increased risk for preeclampsia.  Bed rest.  Reducing how much salt (sodium) you eat.  Medicine to lower your blood pressure.  Staying in the hospital, if your condition is severe. There, treatment will focus on controlling your blood pressure and the amount of fluids in your body (fluid retention).  You may need to take medicine (magnesium sulfate) to prevent seizures. This medicine may be given as an injection or through an IV tube.  Delivering your baby early, if your condition gets worse. You may have your labor  started with medicine (induced), or you may have a cesarean delivery.  Follow these instructions at home: Eating and drinking   Drink enough fluid to keep your urine clear or pale yellow.  Eat a healthy diet that is low in sodium. Do not add salt to your food. Check nutrition labels to see how much sodium a food or beverage contains.  Avoid caffeine. Lifestyle  Do not use any products that contain nicotine or tobacco, such as cigarettes and e-cigarettes. If you need help quitting, ask your health care provider.  Do  not use alcohol or drugs.  Avoid stress as much as possible. Rest and get plenty of sleep. General instructions  Take over-the-counter and prescription medicines only as told by your health care provider.  When lying down, lie on your side. This keeps pressure off of your baby.  When sitting or lying down, raise (elevate) your feet. Try putting some pillows underneath your lower legs.  Exercise regularly. Ask your health care provider what kinds of exercise are best for you.  Keep all follow-up and prenatal visits as told by your health care provider. This is important. How is this prevented? To prevent preeclampsia or eclampsia from developing during another pregnancy:  Get proper medical care during pregnancy. Your health care provider may be able to prevent preeclampsia or diagnose and treat it early.  Your health care provider may have you take a low-dose aspirin or a calcium supplement during your next pregnancy.  You may have tests of your blood pressure and kidney function after giving birth.  Maintain a healthy weight. Ask your health care provider for help managing weight gain during pregnancy.  Work with your health care provider to manage any long-term (chronic) health conditions you have, such as diabetes or kidney problems.  Contact a health care provider if:  You gain more weight than expected.  You have headaches.  You have nausea or  vomiting.  You have abdominal pain.  You feel dizzy or light-headed. Get help right away if:  You develop sudden or severe swelling anywhere in your body. This usually happens in the legs.  You gain 5 lbs (2.3 kg) or more during one week.  You have severe: ? Abdominal pain. ? Headaches. ? Dizziness. ? Vision problems. ? Confusion. ? Nausea or vomiting.  You have a seizure.  You have trouble moving any part of your body.  You develop numbness in any part of your body.  You have trouble speaking.  You have any abnormal bleeding.  You pass out. This information is not intended to replace advice given to you by your health care provider. Make sure you discuss any questions you have with your health care provider. Document Released: 07/12/2000 Document Revised: 03/12/2016 Document Reviewed: 02/19/2016 Elsevier Interactive Patient Education  [employer].

## 2018-07-02 NOTE — Lactation Note (Signed)
This note was copied from a baby's chart. Lactation Consultation Note  Patient Name: Phyllis Farmer WUJWJ'XToday's Date: 07/02/2018 Reason for consult: Follow-up assessment;Difficult latch;Term;[place] Mom reports that baby was latching well until last evening.  Baby now fussy with latch and not sustaining latch. Mom can easily hand express colostrum.  Assisted with positioning baby first in cross cradle hold and then football hold.  Baby came off breast after 1-2 minutes and needed to be relatched several times.  Clicking noted with feeding and no swallows observed.  Baby fussy and actng very hungry.  24 mm nipple shield applied and initially baby latched better but became fussy after a few minutes.  Mom pumped 3 mls and baby was spoon fed.  Mom asking for formula supplementation.  Mom offered formula with slow flow nipple but now baby sleepy.  Discussed milk coming to volume and the prevention and treatment of engorgement.  Manual pump given with instructions.  Mom will formula feed on cue if baby continues to have a difficult latch.  Instructed to pump every 3 hours.  Lactation outpatient services and support reviewed and encouraged prn.  Maternal Data    Feeding Feeding Type: Breast Milk  LATCH Score Latch: Repeated attempts needed to sustain latch, nipple held in mouth throughout feeding, stimulation needed to elicit sucking reflex.  Audible Swallowing: None  Type of Nipple: Everted at rest and after stimulation  Comfort (Breast/Nipple): Soft / non-tender  Hold (Positioning): Assistance needed to correctly position infant at breast and maintain latch.  LATCH Score: 6  Interventions Interventions: Assisted with latch;Breast compression;Skin to skin;Adjust position;Breast massage;Support pillows;Hand express;Position options;DEBP  Lactation Tools Discussed/Used Tools: Nipple Shields Nipple shield size: 24   Consult Status Consult Status: Complete Follow-up type:  In-patient    Huston FoleyMOULDEN, Jerico Grisso S 07/02/2018, 9:49 AM

## 2018-07-02 NOTE — Progress Notes (Signed)
CSW received consult for hx of marijuana use.  Referral was screened out due to the following: ~MOB had no documented substance use after initial prenatal visit/+UPT. ~MOB had no positive drug screens after initial prenatal visit/+UPT. ~Baby's UDS is negative.  Please consult CSW if current concerns arise or by MOB's request.  CSW will monitor CDS results and make report to Child Protective Services if warranted.  Mickelle Goupil, LCSWA Clinical Social Worker [facility] Cell#: [phone]  

## 2018-07-02 NOTE — Discharge Summary (Signed)
OB Discharge Summary     Patient Name: Phyllis Farmer DOB: 1995/08/17 MRN: [id number]  Date of admission: 06/29/2018 Delivering MD: Essie Hart   Date of discharge: 07/02/2018  Admitting diagnosis: 40wks water broke Intrauterine pregnancy: [redacted]w[redacted]d     Secondary diagnosis:  Active Problems:   Delayed delivery after SROM (spontaneous rupture of membranes)   Preeclampsia   Discharge diagnosis: Term Pregnancy Delivered and Preeclampsia (mild)                                                                                                Post partum procedures:n/a  Augmentation: Pitocin  Complications: None  Hospital course:  Onset of Labor With Vaginal Delivery     22 y.o. yo G1P1001 at [redacted]w[redacted]d was admitted in Latent Labor on 06/29/2018. Patient had an uncomplicated labor course as follows:  Membrane Rupture Time/Date: 8:00 AM ,06/29/2018   Intrapartum Procedures: Episiotomy: None [1]                                         Lacerations:  None [1]  Patient had a delivery of a Viable infant. 06/30/2018  Information for the patient's newborn:  Adelayde, Minney [[id number]]  Delivery Method: Vaginal, Spontaneous(Filed from Delivery Summary)    Pateint had an uncomplicated postpartum course.  She is ambulating, tolerating a regular diet, passing flatus, and urinating well. Patient is discharged home in stable condition on 07/02/18.   Physical exam  Vitals:   07/01/18 0523 07/01/18 1433 07/02/18 0011 07/02/18 0529  BP: 117/82 131/73 126/67 121/76  Pulse: 69 85 76 74  Resp: 16 16    Temp: 97.7 F (36.5 C) 98.1 F (36.7 C) 98.2 F (36.8 C) 98.8 F (37.1 C)  TempSrc: Oral Oral Oral   SpO2: 100% 100% 99%   Weight:       General: alert, cooperative and no distress Lochia: appropriate Uterine Fundus: firm Incision: N/A DVT Evaluation: No evidence of DVT seen on physical exam. Negative Homan's sign. No cords or calf tenderness. No significant calf/ankle  edema. Preeclampsia: patient denies symptoms of preeclampsia: headache, vision changes, epigastric pain   Labs: Lab Results  Component Value Date   WBC 13.5 (H) 07/01/2018   HGB 8.3 (L) 07/01/2018   HCT 27.3 (L) 07/01/2018   MCV 82.7 07/01/2018   PLT 197 07/01/2018   CMP Latest Ref Rng & Units 06/30/2018  Glucose 70 - 99 mg/dL 87  BUN 6 - 20 mg/dL 7  Creatinine 7.82 - 9.56 mg/dL 2.13  Sodium 086 - 578 mmol/L 135  Potassium 3.5 - 5.1 mmol/L 3.7  Chloride 98 - 111 mmol/L 106  CO2 22 - 32 mmol/L 20(L)  Calcium 8.9 - 10.3 mg/dL 8.9  Total Protein 6.5 - 8.1 g/dL 6.9  Total Bilirubin 0.3 - 1.2 mg/dL 4.6(N)  Alkaline Phos 38 - 126 U/L 160(H)  AST 15 - 41 U/L 21  ALT 0 - 44 U/L 13    Discharge instruction: per After Visit Summary and "Baby  and Me Booklet". Preeclampsia precautions discussed and handout given. Patient verbalizes she will call or present to [facility] with any symptoms of preeclampsia.   After visit meds:  Allergies as of 07/02/2018   No Known Allergies     Medication List    TAKE these medications   ferrous sulfate 325 (65 FE) MG tablet Take 1 tablet (325 mg total) by mouth daily with breakfast.   ibuprofen 600 MG tablet Commonly known as:  ADVIL,MOTRIN Take 1 tablet (600 mg total) by mouth every 6 (six) hours as needed for mild pain or moderate pain.   PRENATAL VITAMIN PO Take by mouth.       Diet: routine diet. Patient has mild asymptomatic anemia, PO Iron ordered QD.   Activity: Advance as tolerated. Pelvic rest for 6 weeks.   Outpatient follow up:1 week for blood pressure check, 5 weeks for postpartum visit, and 6 weeks for nexplanon insertion Follow up Appt:No future appointments. Follow up Visit:No follow-ups on file.  Postpartum contraception: Nexplanon  Newborn Data: Live born female  Birth Weight: 7 lb 3.2 oz (3265 g) APGAR: 8, 9  Newborn Delivery   Birth date/time:  06/30/2018 12:18:00 Delivery type:  Vaginal, Spontaneous     Baby  Feeding: Breast Disposition:home with mother   07/02/2018 Janeece RiggersEllis K Darbie Biancardi, CNM

## 2018-10-08 IMAGING — US US OB TRANSVAGINAL
1 series · 13 of 28 positions shown · non-contrast
Comparison: None.

CLINICAL DATA: Pregnant patient in first-trimester pregnancy with
pelvic pain. Beta HCG 37

EXAM:
OBSTETRIC <14 WK US AND TRANSVAGINAL OB US
TECHNIQUE: Both transabdominal and transvaginal ultrasound examinations were
performed for complete evaluation of the gestation as well as the
maternal uterus, adnexal regions, and pelvic cul-de-sac.
Transvaginal technique was performed to assess early pregnancy.

[Series 1: us ob transvaginal · 0.23mm/px · 48 acquisitions, 13 frames shown]
[im 2/48]
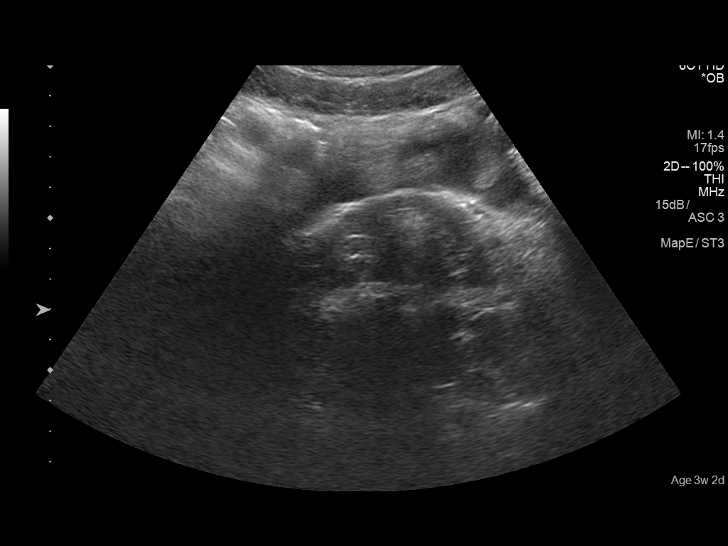
[im 6/48]
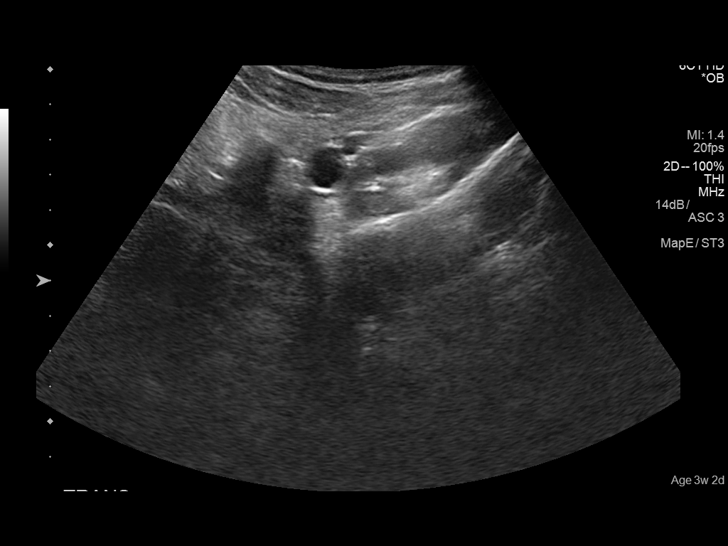
[im 9/48]
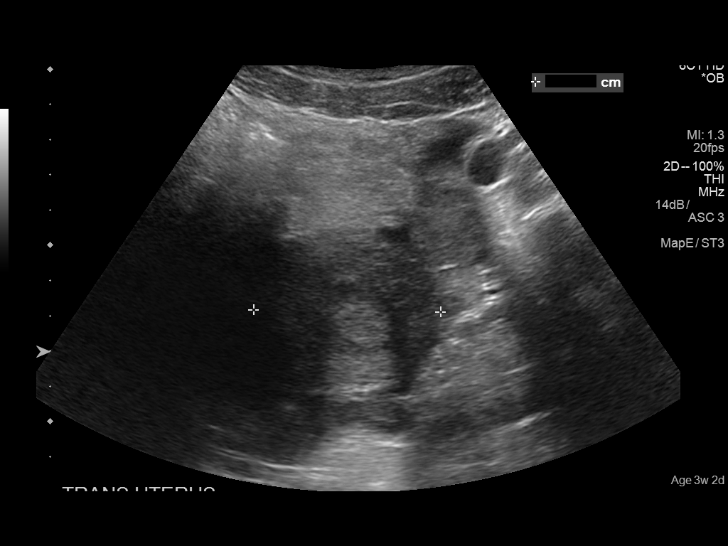
[im 13/48]
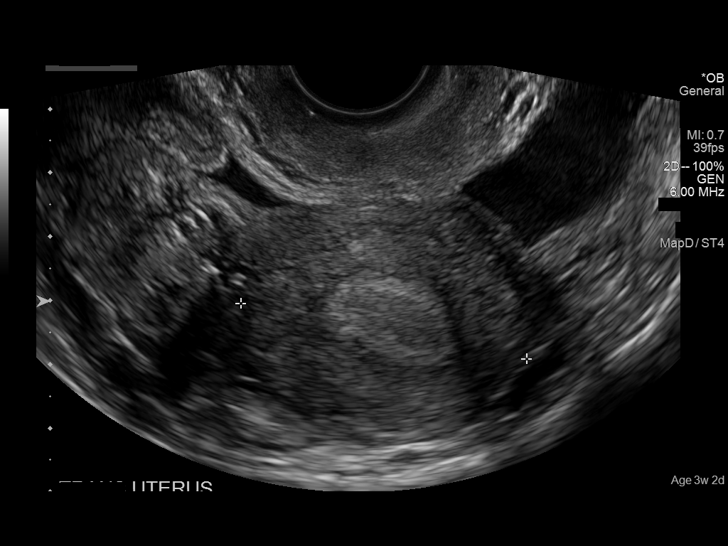
[im 16/48]
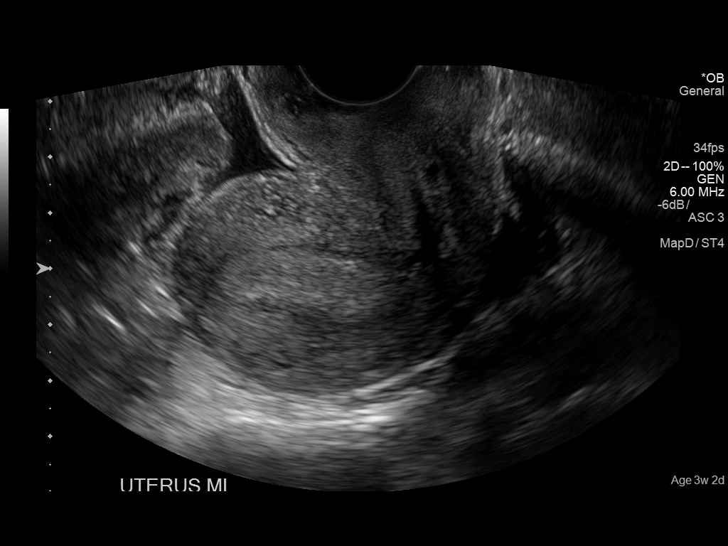
[im 20/48]
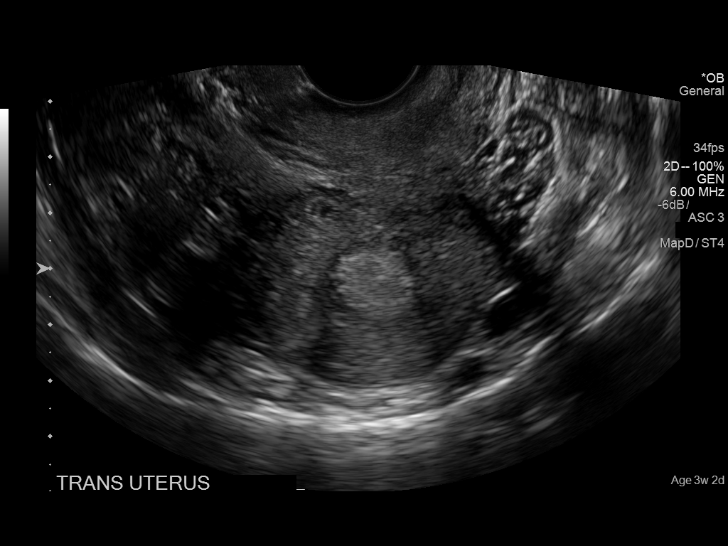
[im 25/48]
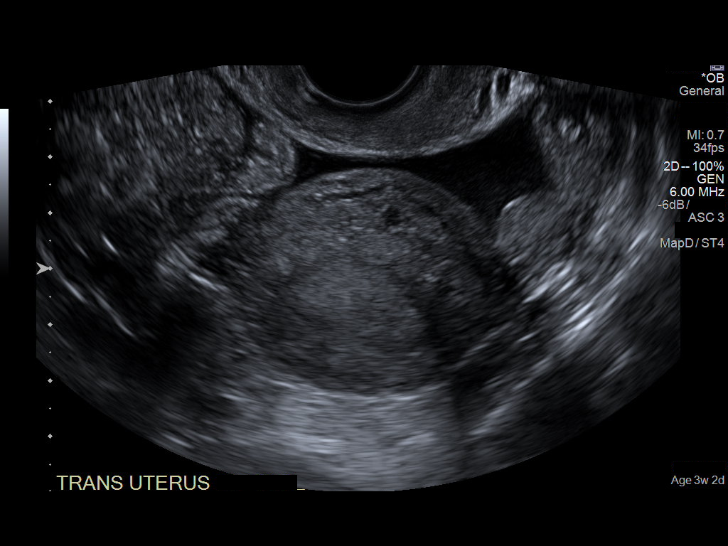
[im 28/48]
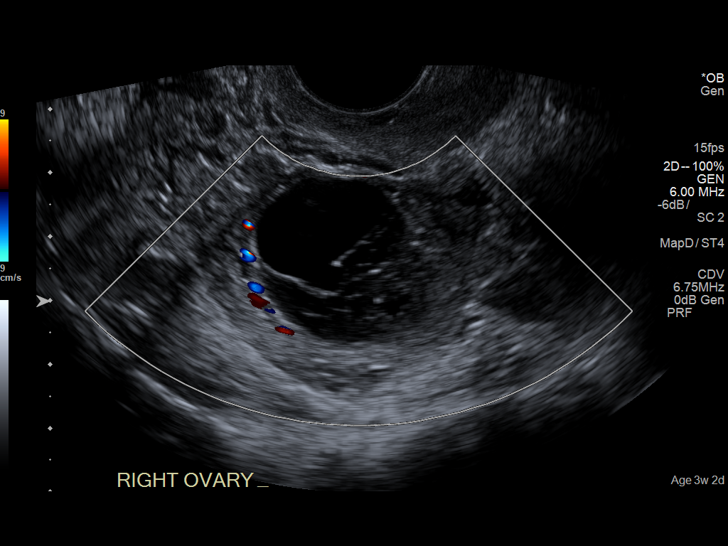
[im 32/48]
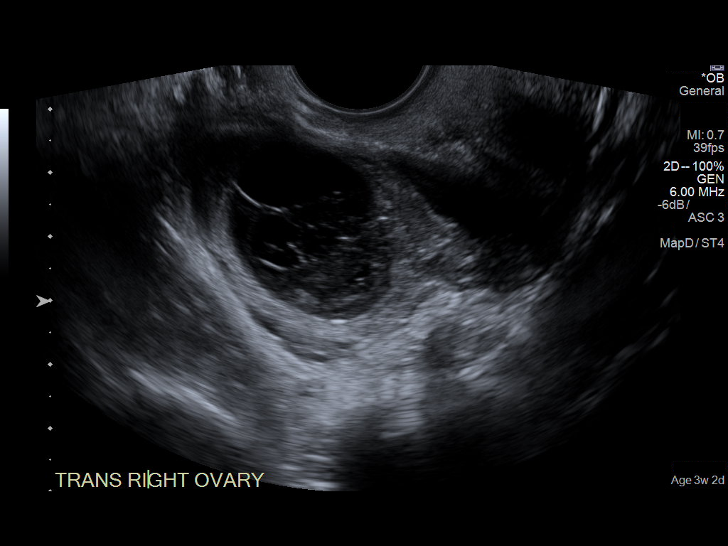
[im 35/48]
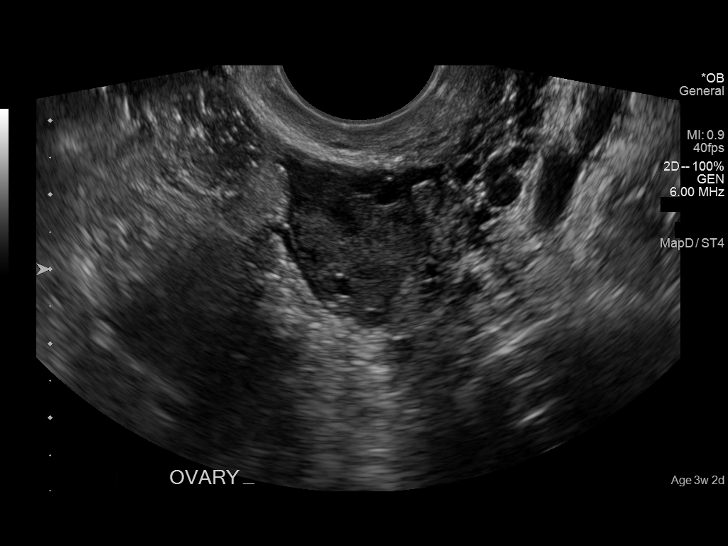
[im 39/48]
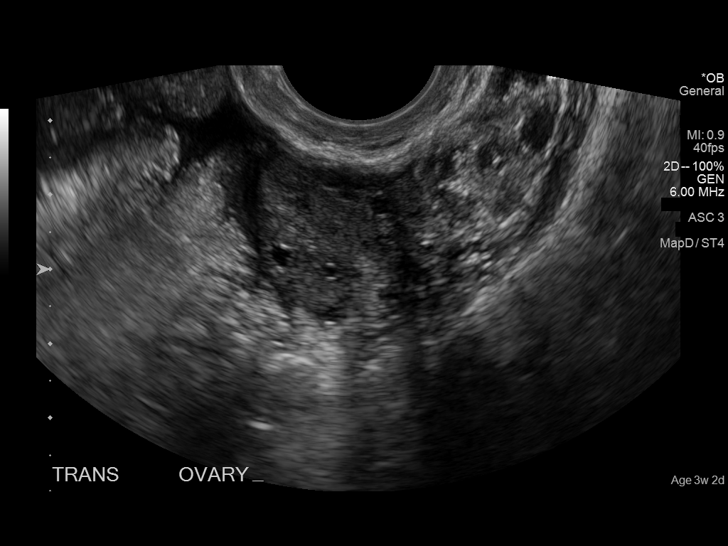
[im 42/48]
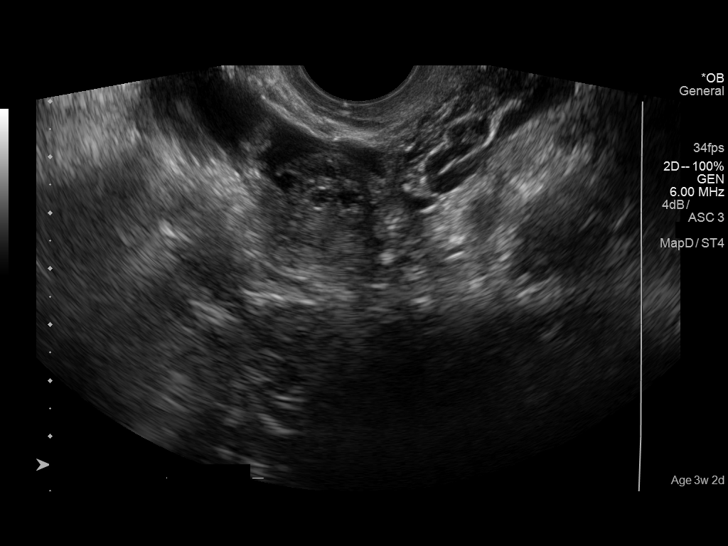
[im 46/48]
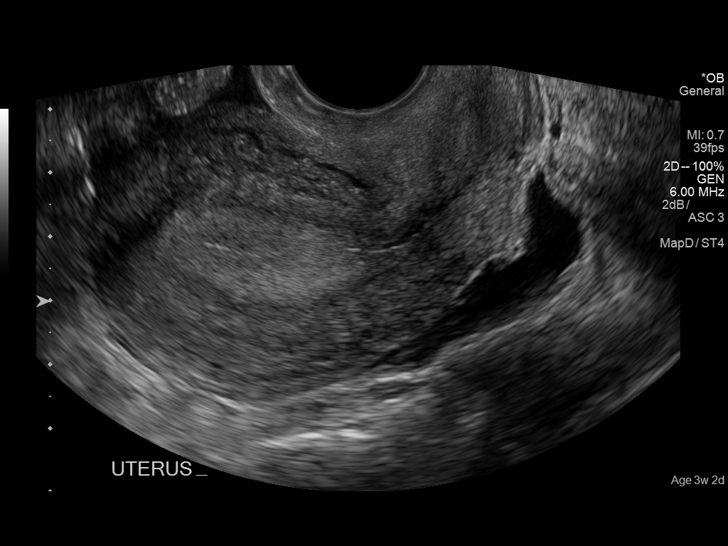

[13 of 28 positions shown; findings below may reference images not displayed]

FINDINGS: Intrauterine gestational sac: None

Yolk sac:  Not Visualized.

Embryo:  Not Visualized.

Cardiac Activity: Not Visualized.

Subchorionic hemorrhage:  Not applicable.

Maternal uterus/adnexae: Mild endometrial thickening of 12 mm. No
fluid or gestational sac in the endometrial canal. No uterine mass
or fibroid. Right ovary measures 4.8 x 3.3 x 3.6 cm and contains a
complex 2.7 x 2.7 x 2.5 cm cyst with Joshjax internal echoes. Blood
flow is noted to the ovarian parenchyma. Left ovary is normal
measuring 2.5 x 1.9 x 1.9 cm. Small volume of simple free fluid in
the cul-de-sac.
IMPRESSION: 1. No intrauterine pregnancy or findings suspicious for ectopic
pregnancy. Findings are consistent with pregnancy of unknown
location and may reflect early intrauterine pregnancy not yet
visualized sonographically, occult ectopic pregnancy, or failed
pregnancy. Recommend trending of beta HCG and sonographic follow-up
in 10-14 days as indicated.
2. Complex 2.7 cm right ovarian cyst has the appearance of a
hemorrhagic cyst. There is blood flow to the right ovary, no
evidence of torsion. Attention to this at follow-up recommended

## 2020-08-29 ENCOUNTER — Ambulatory Visit (HOSPITAL_COMMUNITY)
Admission: EM | Admit: 2020-08-29 | Discharge: 2020-08-29 | Disposition: A | Discharge status: Home / Self Care | Payer: Medicaid Other

## 2020-08-29 ENCOUNTER — Other Ambulatory Visit: Payer: Self-pay

## 2020-08-30 ENCOUNTER — Encounter (HOSPITAL_COMMUNITY): Payer: Self-pay

## 2020-08-30 ENCOUNTER — Ambulatory Visit (HOSPITAL_COMMUNITY)
Admission: EM | Admit: 2020-08-30 | Discharge: 2020-08-30 | Disposition: A | Discharge status: Home / Self Care | Payer: Medicaid Other | Attending: Family Medicine | Admitting: Family Medicine

## 2020-08-30 VITALS — BP 110/71 | HR 70 | Temp 99.3°F | Resp 18

## 2020-08-30 DIAGNOSIS — Z113 Encounter for screening for infections with a predominantly sexual mode of transmission: Secondary | ICD-10-CM | POA: Diagnosis not present

## 2020-08-30 LAB — HIV ANTIBODY (ROUTINE TESTING W REFLEX): HIV Screen 4th Generation wRfx: NONREACTIVE

## 2020-08-30 NOTE — ED Provider Notes (Signed)
[facility]   [id number] 08/30/20 Arrival Time: 1202  ASSESSMENT & PLAN:  1. Screening for STDs (sexually transmitted diseases)       Discharge Instructions     We have sent testing for sexually transmitted infections. We will notify you of any positive results once they are received. If required, we will prescribe any medications you might need.    Without s/s of PID.  Labs Reviewed  RPR  HIV ANTIBODY (ROUTINE TESTING W REFLEX)  CERVICOVAGINAL ANCILLARY ONLY    Pending: Unresulted Labs (From admission, onward)          Start     Ordered   08/30/20 1304  RPR  (STI Exposure)  Once,   STAT        08/30/20 1303   08/30/20 1304  HIV antibody  (STI Exposure)  Once,   STAT        08/30/20 1303           Will notify of any positive results. Instructed to refrain from sexual activity for at least seven days.  Reviewed expectations re: course of current medical issues. Questions answered. Outlined signs and symptoms indicating need for more acute intervention. Patient verbalized understanding. After Visit Summary given.   SUBJECTIVE:  Phyllis Farmer is a 25 y.o. female who requests STD screening. No symptoms. Afebrile. No abdominal or pelvic pain. Normal PO intake wihout n/v. No genital rashes or lesions.  Patient's last menstrual period was 08/19/2020 (exact date).   OBJECTIVE:  Vitals:   08/30/20 1241  BP: 110/71  Pulse: 70  Resp: 18  Temp: 99.3 F (37.4 C)  TempSrc: Oral  SpO2: 96%     General appearance: alert, cooperative, appears stated age and no distress Lungs: unlabored respirations; speaks full sentences without difficulty Back: no CVA tenderness; FROM at waist Abdomen: soft, non-tender GU: deferred Skin: warm and dry Psychological: alert and cooperative; normal mood and affect.   Pending: Labs Reviewed  RPR  HIV ANTIBODY (ROUTINE TESTING W REFLEX)  CERVICOVAGINAL ANCILLARY ONLY    Allergies  Allergen Reactions  .  Kiwi Extract Itching and Swelling    Tongue     Past Medical History:  Diagnosis Date  . Infection    UTI  . Medical history non-contributory    Family History  Problem Relation Age of Onset  . Healthy Mother   . Heart disease Father 105       heart attack   Social History   Socioeconomic History  . Marital status: Single    Spouse name: Not on file  . Number of children: Not on file  . Years of education: Not on file  . Highest education level: Not on file  Occupational History  . Not on file  Tobacco Use  . Smoking status: Never Smoker  . Smokeless tobacco: Never Used  Vaping Use  . Vaping Use: Never used  Substance and Sexual Activity  . Alcohol use: Not Currently    Comment: occasionally  . Drug use: Not Currently    Types: Marijuana    Comment: last summer 2019  . Sexual activity: Not Currently    Birth control/protection: None  Other Topics Concern  . Not on file  Social History Narrative  . Not on file   Social Determinants of Health   Financial Resource Strain: Not on file  Food Insecurity: Not on file  Transportation Needs: Not on file  Physical Activity: Not on file  Stress: Not on file  Social  Connections: Not on file  Intimate Partner Violence: Not on file          Mardella Layman, MD 08/30/20 1316

## 2020-08-30 NOTE — ED Triage Notes (Signed)
Pt presents with a request for STD. Pt states she has not had an exposure to a partner who has an STD. Pt states she is not having sxs.

## 2020-08-30 NOTE — Discharge Instructions (Signed)
We have sent testing for sexually transmitted infections. We will notify you of any positive results once they are received. If required, we will prescribe any medications you might need. °

## 2020-08-31 LAB — CERVICOVAGINAL ANCILLARY ONLY
Bacterial Vaginitis (gardnerella): POSITIVE — AB
Candida Glabrata: NEGATIVE
Candida Vaginitis: POSITIVE — AB
Chlamydia: NEGATIVE
Comment: NEGATIVE
Comment: NEGATIVE
Comment: NEGATIVE
Comment: NEGATIVE
Comment: NEGATIVE
Comment: NORMAL
Neisseria Gonorrhea: NEGATIVE
Trichomonas: NEGATIVE

## 2020-08-31 LAB — RPR: RPR Ser Ql: NONREACTIVE

## 2020-09-01 ENCOUNTER — Telehealth (HOSPITAL_COMMUNITY): Payer: Self-pay | Admitting: Emergency Medicine

## 2020-09-01 MED ORDER — METRONIDAZOLE 500 MG PO TABS: 500.0000 mg | ORAL_TABLET | Freq: Two times a day (BID) | ORAL | 0 refills | Status: AC

## 2020-09-01 MED ORDER — FLUCONAZOLE 150 MG PO TABS: 150.0000 mg | ORAL_TABLET | Freq: Once | ORAL | 0 refills | Status: AC

## 2024-01-06 ENCOUNTER — Other Ambulatory Visit: Payer: Self-pay

## 2024-01-06 ENCOUNTER — Ambulatory Visit (INDEPENDENT_AMBULATORY_CARE_PROVIDER_SITE_OTHER): Payer: No Typology Code available for payment source | Admitting: Radiology

## 2024-01-06 ENCOUNTER — Ambulatory Visit
Admission: EM | Admit: 2024-01-06 | Discharge: 2024-01-06 | Disposition: A | Discharge status: Home / Self Care | Payer: No Typology Code available for payment source | Attending: Physician Assistant | Admitting: Physician Assistant

## 2024-01-06 DIAGNOSIS — R1031 Right lower quadrant pain: Secondary | ICD-10-CM | POA: Diagnosis present

## 2024-01-06 DIAGNOSIS — R0781 Pleurodynia: Secondary | ICD-10-CM | POA: Diagnosis not present

## 2024-01-06 DIAGNOSIS — R109 Unspecified abdominal pain: Secondary | ICD-10-CM

## 2024-01-06 DIAGNOSIS — M542 Cervicalgia: Secondary | ICD-10-CM | POA: Insufficient documentation

## 2024-01-06 DIAGNOSIS — M25511 Pain in right shoulder: Secondary | ICD-10-CM | POA: Diagnosis not present

## 2024-01-06 LAB — POCT URINALYSIS DIP (MANUAL ENTRY)
Bilirubin, UA: NEGATIVE
Blood, UA: NEGATIVE
Glucose, UA: NEGATIVE mg/dL
Ketones, POC UA: NEGATIVE mg/dL
Nitrite, UA: NEGATIVE
Protein Ur, POC: NEGATIVE mg/dL
Spec Grav, UA: 1.025 (ref 1.010–1.025)
Urobilinogen, UA: 1 U/dL
pH, UA: 6 (ref 5.0–8.0)

## 2024-01-06 LAB — POCT URINE PREGNANCY: Preg Test, Ur: NEGATIVE

## 2024-01-06 NOTE — ED Provider Notes (Signed)
 Phyllis Farmer    CSN: [id number] Arrival date & time: 01/06/24  1823      History   Chief Complaint Chief Complaint  Patient presents with   Abdominal Cramping    HPI Phyllis Farmer is a 28 y.o. female.   HPI She reports pain/ cramping has been ongoing for about 2 weeks  Pt reports concerns abdominal cramping along the right side of her RLQ  She states she is also having cramping in the right side of her neck and shoulder and under her rib cage  She states that when cramping hits she has to take deep breathes to aid with pain  She initially thought this was from her period but it was different from her typical cramping  She denies dysuria, vaginal bleeding or discharge   She was seen at her OB/GYN on 12/03/23 and tested for STDs and Pap smear and everything was normal  She was last sexually active on 12/20/23    Patient reports that she has also developed right-sided neck and shoulder pain that radiates into the thoracic area.  She reports that this started a bit more recently and has taken some precedence over her abdominal issues due to discomfort.   Past Medical History:  Diagnosis Date   Infection    UTI   Medical history non-contributory     Patient Active Problem List   Diagnosis Date Noted   Preeclampsia 07/02/2018   Delayed delivery after SROM (spontaneous rupture of membranes) 06/29/2018    Past Surgical History:  Procedure Laterality Date   NO PAST SURGERIES      OB History     Gravida  1   Para  1   Term  1   Preterm      AB      Living  1      SAB      IAB      Ectopic      Multiple  0   Live Births  1            Home Medications    Prior to Admission medications   Medication Sig Start Date End Date Taking? Authorizing Provider  ferrous sulfate  325 (65 FE) MG tablet Take 1 tablet (325 mg total) by mouth daily with breakfast. 07/02/18 09/30/18  Elease Grice, CNM  ibuprofen  (ADVIL ,MOTRIN ) 600 MG tablet Take 1  tablet (600 mg total) by mouth every 6 (six) hours as needed for mild pain or moderate pain. 07/02/18   Elease Grice, CNM  metroNIDAZOLE  (FLAGYL ) 500 MG tablet Take 1 tablet (500 mg total) by mouth 2 (two) times daily. 09/01/20   Lamptey, Donley Furth, MD  Prenatal Vit-Fe Fumarate-FA (PRENATAL VITAMIN PO) Take by mouth.    [provider]    Family History Family History  Problem Relation Age of Onset   Healthy Mother    Heart disease Father 35       heart attack    Social History Social History   Tobacco Use   Smoking status: Never   Smokeless tobacco: Never  Vaping Use   Vaping status: Never Used  Substance Use Topics   Alcohol use: Not Currently    Comment: occasionally   Drug use: Not Currently    Types: Marijuana    Comment: last summer 2019     Allergies   Kiwi extract   Review of Systems Review of Systems  Constitutional:  Positive for diaphoresis. Negative for chills and fever.  Gastrointestinal:  Positive for abdominal pain.  Genitourinary:  Positive for flank pain and pelvic pain (right sided). Negative for dysuria, vaginal bleeding, vaginal discharge and vaginal pain.  Musculoskeletal:  Positive for myalgias and neck pain.     Physical Exam Triage Vital Signs ED Triage Vitals  Encounter Vitals Group     BP 01/06/24 1859 (!) 145/78     Systolic BP Percentile --      Diastolic BP Percentile --      Pulse Rate 01/06/24 1859 71     Resp 01/06/24 1859 20     Temp 01/06/24 1859 98.9 F (37.2 C)     Temp Source 01/06/24 1859 Oral     SpO2 01/06/24 1859 98 %     Weight 01/06/24 1905 152 lb (68.9 kg)     Height 01/06/24 1905 5\' 5"  (1.651 m)     Head Circumference --      Peak Flow --      Pain Score 01/06/24 1904 6     Pain Loc --      Pain Education --      Exclude from Growth Chart --    No data found.  Updated Vital Signs BP (!) 145/78 (BP Location: Right Arm)   Pulse 71   Temp 98.9 F (37.2 C) (Oral)   Resp 20   Ht 5\' 5"  (1.651 m)    Wt 152 lb (68.9 kg)   LMP 12/22/2023 (Exact Date)   SpO2 98%   Breastfeeding No   BMI 25.29 kg/m   Visual Acuity Right Eye Distance:   Left Eye Distance:   Bilateral Distance:    Right Eye Near:   Left Eye Near:    Bilateral Near:     Physical Exam Vitals reviewed.  Constitutional:      General: She is awake.     Appearance: Normal appearance. She is well-developed and well-groomed.  HENT:     Head: Normocephalic and atraumatic.  Eyes:     General: Lids are normal. Gaze aligned appropriately.     Extraocular Movements: Extraocular movements intact.     Conjunctiva/sclera: Conjunctivae normal.  Pulmonary:     Effort: Pulmonary effort is normal.     Breath sounds: Normal breath sounds. No decreased air movement. No decreased breath sounds.  Abdominal:     General: Abdomen is flat. Bowel sounds are normal.     Palpations: Abdomen is soft.     Tenderness: There is abdominal tenderness in the right lower quadrant. There is no right CVA tenderness, left CVA tenderness, guarding or rebound. Positive signs include McBurney's sign. Negative signs include Murphy's sign and Rovsing's sign.  Musculoskeletal:     Right shoulder: Tenderness present. No swelling, deformity, effusion or bony tenderness. Normal range of motion. Normal strength.       Arms:     Cervical back: Normal range of motion and neck supple. No swelling, edema, erythema, signs of trauma, spasms, tenderness or bony tenderness. Normal range of motion.     Thoracic back: No swelling, deformity, spasms or tenderness.  Neurological:     General: No focal deficit present.     Mental Status: She is alert and oriented to person, place, and time.     GCS: GCS eye subscore is 4. GCS verbal subscore is 5. GCS motor subscore is 6.     Cranial Nerves: No cranial nerve deficit, dysarthria or facial asymmetry.  Psychiatric:        Attention and Perception:  Attention and perception normal.        Mood and Affect: Mood and affect  normal.        Speech: Speech normal.        Behavior: Behavior normal. Behavior is cooperative.      Farmer Treatments / Results  Labs (all labs ordered are listed, but only abnormal results are displayed) Labs Reviewed  POCT URINALYSIS DIP (MANUAL ENTRY) - Abnormal; Notable for the following components:      Result Value   Leukocytes, UA Trace (*)    All other components within normal limits  URINE CULTURE  POCT URINE PREGNANCY  CERVICOVAGINAL ANCILLARY ONLY    EKG   Radiology DG Abdomen 1 View Result Date: 01/06/2024 CLINICAL DATA:  Right flank pain EXAM: ABDOMEN - 1 VIEW COMPARISON:  None Available. FINDINGS: Nonobstructive bowel gas pattern. No pneumatosis. Moderate volume stool within the ascending and transverse colon and rectum. No abnormal radio-opaque calculi or mass effect. No acute or substantial osseous abnormality. The sacrum and coccyx are partially obscured by overlying bowel contents. IMPRESSION: Nonobstructive bowel gas pattern. Moderate volume stool within the ascending and transverse colon and rectum. Electronically Signed   By: Limin  Xu M.D.   On: 01/06/2024 20:07    Procedures Procedures (including critical care time)  Medications Ordered in Farmer Medications - No data to display  Initial Impression / Assessment and Plan / Farmer Course  I have reviewed the triage vital signs and the nursing notes.  Pertinent labs & imaging results that were available during my care of the patient were reviewed by me and considered in my medical decision making (see chart for details).      Final Clinical Impressions(s) / Farmer Diagnoses   Final diagnoses:  Flank pain  Abdominal cramping  Acute pain of right shoulder   Patient presents today with concerns for right lower quadrant abdominal pain that is been ongoing for about 2 weeks.  She also reports similar pain in the right flank just under the ribs.  Urine dip was notable for trace leukocytes.  Will send urine culture for  definitive rule out of UTI.  Results to dictate further management.  Given symptoms KUB was obtained which did not show obvious signs of nephrolithiasis.  I reviewed with the patient that KUB is not fully definitive for rule out as some stones are not radiopaque and they can also be obstructed by abdominal organs.  Recommend CT renal stone study for definitive rule out either through emergency room or PCP.  Given chronicity of symptoms, vitals, largely reassuring physical exam I am less suspicious for acute abdomen today.  At this time differential includes but is not limited to mesenteric adenitis, ovarian cyst, abdominal muscle strain.  Cervicovaginal swab collected today to assess for BV, trichomoniasis, yeast, gonorrhea, chlamydia for rule out.  Results to dictate further management.  For now recommend over-the-counter medications as needed for symptomatic relief . ED and return precautions reviewed and provided after visit summary.  Follow-up as needed. Patient also voices concerns for right-sided neck pain as well as right shoulder pain along the shoulder blade.  Physical exam does not reveal obvious range of motion deficits or decreased strength.  At this time suspect likely trapezius muscle injury or strain recommend conservative measures for management.  Reviewed with patient that if symptoms are not improving over the next 1 to 2 weeks she should follow-up with orthopedics for further evaluation and ongoing management.  Follow-up as needed  Discharge Instructions      You were seen today for concerns of abdominal cramping and right lower quadrant pain that has been ongoing for about 2 weeks Today we did a urine dip to assess for potential UTI or blood in the urine.  Your urine dip did show signs of white blood cells which can be consistent with a UTI so I have sent a sample of your urine off for a urine culture.  We will keep you updated with those results once they are available and send in  any medications as indicated by those results if needed. We have also collected a cervicovaginal to assess for bacterial vaginosis, trichomonas, yeast,, chlamydia. We will keep you updated  with those results and send in any  medications as indicated to the pharmacy that we have on file.  Your x-ray was negative for signs of a kidney stone today.  As discussed a stone may be obstructed by the abdominal organs or it may not be visible on x-ray so this is not a definitive rule out.  I would recommend getting a CT scan for a more definitive answer regarding potential kidney stone or some of the other conditions that we discussed during her appointment.  Based on your symptoms and physical exam I believe the following is the cause of your neck and shoulder pain Back pain likely secondary to a strain of your back muscles (the trapezius) I recommend the following at this time to help relieve that discomfort:  Rest Warm compresses to the area (20 minutes on, minimum of 30 minutes off) You can alternate Tylenol  and Ibuprofen  for pain management but Ibuprofen  is typically preferred to reduce inflammation.  Gentle stretches and exercises that I have included in your paperwork Try to reduce excess strain to the area and rest as much as possible  Wear supportive shoes and, if you must lift anything, use proper lifting techniques that spare your back.   If these measures do not lead to significant improvement or you feel your symptoms are getting worse I recommend following up with orthopedics for further evaluation  [facility] [address], [address], [place], [place] [address] [phone]  [facility] [address], [place], [place] [address]  [phone]      ED Prescriptions   None    PDMP not reviewed this encounter.   Dannon Nguyenthi E, PA-C 01/06/24 2104

## 2024-01-06 NOTE — ED Triage Notes (Signed)
 Pt presents with complaints of abdominal cramping x 2.5 weeks. Pt currently rates her pain a 6/10. Having sharp pains on the right side of body at this time. This pain just started yesterday 6/9. Abdominal cramping is intermittent, not present currently. Has followed up with OB-GYN, told patient to take Tylenol  however this is not helping. All tests performed in-office came back negative.

## 2024-01-06 NOTE — Discharge Instructions (Signed)
 You were seen today for concerns of abdominal cramping and right lower quadrant pain that has been ongoing for about 2 weeks Today we did a urine dip to assess for potential UTI or blood in the urine.  Your urine dip did show signs of white blood cells which can be consistent with a UTI so I have sent a sample of your urine off for a urine culture.  We will keep you updated with those results once they are available and send in any medications as indicated by those results if needed. We have also collected a cervicovaginal to assess for bacterial vaginosis, trichomonas, yeast,, chlamydia. We will keep you updated  with those results and send in any  medications as indicated to the pharmacy that we have on file.  Your x-ray was negative for signs of a kidney stone today.  As discussed a stone may be obstructed by the abdominal organs or it may not be visible on x-ray so this is not a definitive rule out.  I would recommend getting a CT scan for a more definitive answer regarding potential kidney stone or some of the other conditions that we discussed during her appointment.  Based on your symptoms and physical exam I believe the following is the cause of your neck and shoulder pain Back pain likely secondary to a strain of your back muscles (the trapezius) I recommend the following at this time to help relieve that discomfort:  Rest Warm compresses to the area (20 minutes on, minimum of 30 minutes off) You can alternate Tylenol  and Ibuprofen  for pain management but Ibuprofen  is typically preferred to reduce inflammation.  Gentle stretches and exercises that I have included in your paperwork Try to reduce excess strain to the area and rest as much as possible  Wear supportive shoes and, if you must lift anything, use proper lifting techniques that spare your back.   If these measures do not lead to significant improvement or you feel your symptoms are getting worse I recommend following up with  orthopedics for further evaluation  [facility] [address], [address], [place], [place] [address] [phone]  [facility] [address], [place], [place] [address]  [phone]

## 2024-01-07 ENCOUNTER — Ambulatory Visit (HOSPITAL_COMMUNITY): Payer: Self-pay

## 2024-01-07 LAB — CERVICOVAGINAL ANCILLARY ONLY
Bacterial Vaginitis (gardnerella): POSITIVE — AB
Candida Glabrata: NEGATIVE
Candida Vaginitis: NEGATIVE
Chlamydia: POSITIVE — AB
Comment: NEGATIVE
Comment: NEGATIVE
Comment: NEGATIVE
Comment: NEGATIVE
Comment: NEGATIVE
Comment: NORMAL
Neisseria Gonorrhea: NEGATIVE
Trichomonas: NEGATIVE

## 2024-01-07 LAB — URINE CULTURE: Culture: <10000 — AB

## 2024-01-07 MED ORDER — DOXYCYCLINE HYCLATE 100 MG PO TABS: 100.0000 mg | ORAL_TABLET | Freq: Two times a day (BID) | ORAL | 0 refills | Status: AC
